# Patient Record
Sex: Female | Born: 1990 | Race: Asian | Hispanic: No | Marital: Single | Smoking: Never smoker
Health system: Southern US, Community
[De-identification: ages and names within clinical notes are randomized; demographics above are authoritative.]

## PROBLEM LIST (undated history)

## (undated) ENCOUNTER — Inpatient Hospital Stay (HOSPITAL_COMMUNITY): Payer: Self-pay

## (undated) DIAGNOSIS — Z789 Other specified health status: Secondary | ICD-10-CM

## (undated) HISTORY — PX: ECTOPIC PREGNANCY SURGERY: SHX613

---

## 2016-09-06 ENCOUNTER — Other Ambulatory Visit (HOSPITAL_COMMUNITY): Payer: Self-pay | Admitting: Nurse Practitioner

## 2016-09-06 DIAGNOSIS — Z369 Encounter for antenatal screening, unspecified: Secondary | ICD-10-CM

## 2016-09-06 LAB — OB RESULTS CONSOLE HIV ANTIBODY (ROUTINE TESTING): HIV: NONREACTIVE

## 2016-09-06 LAB — OB RESULTS CONSOLE GC/CHLAMYDIA
CHLAMYDIA, DNA PROBE: NEGATIVE
GC PROBE AMP, GENITAL: NEGATIVE

## 2016-09-06 LAB — OB RESULTS CONSOLE RUBELLA ANTIBODY, IGM: Rubella: IMMUNE

## 2016-09-06 LAB — OB RESULTS CONSOLE RPR: RPR: NONREACTIVE

## 2016-09-06 LAB — OB RESULTS CONSOLE HEPATITIS B SURFACE ANTIGEN: HEP B S AG: NEGATIVE

## 2016-09-14 ENCOUNTER — Encounter (HOSPITAL_COMMUNITY): Payer: Self-pay | Admitting: *Deleted

## 2016-09-15 ENCOUNTER — Encounter (HOSPITAL_COMMUNITY): Payer: Self-pay

## 2016-09-15 ENCOUNTER — Ambulatory Visit (HOSPITAL_COMMUNITY): Admission: RE | Admit: 2016-09-15 | Payer: Medicaid Other | Source: Ambulatory Visit

## 2016-09-15 ENCOUNTER — Other Ambulatory Visit (HOSPITAL_COMMUNITY): Payer: Self-pay | Admitting: Nurse Practitioner

## 2016-09-15 ENCOUNTER — Ambulatory Visit (HOSPITAL_COMMUNITY)
Admission: RE | Admit: 2016-09-15 | Discharge: 2016-09-15 | Disposition: A | Discharge status: Home / Self Care | Payer: Medicaid Other | Source: Ambulatory Visit | Attending: Nurse Practitioner | Admitting: Nurse Practitioner

## 2016-09-15 VITALS — BP 107/53 | HR 76 | Wt 132.2 lb

## 2016-09-15 DIAGNOSIS — Z369 Encounter for antenatal screening, unspecified: Secondary | ICD-10-CM

## 2016-09-15 DIAGNOSIS — Z3A15 15 weeks gestation of pregnancy: Secondary | ICD-10-CM

## 2016-09-15 DIAGNOSIS — Z36 Encounter for antenatal screening of mother: Secondary | ICD-10-CM | POA: Insufficient documentation

## 2016-09-15 DIAGNOSIS — Z3689 Encounter for other specified antenatal screening: Secondary | ICD-10-CM

## 2016-09-15 DIAGNOSIS — Z3492 Encounter for supervision of normal pregnancy, unspecified, second trimester: Secondary | ICD-10-CM

## 2016-09-15 HISTORY — DX: Other specified health status: Z78.9

## 2016-10-06 ENCOUNTER — Ambulatory Visit (HOSPITAL_COMMUNITY)
Admission: RE | Admit: 2016-10-06 | Discharge: 2016-10-06 | Disposition: A | Discharge status: Home / Self Care | Payer: Medicaid Other | Source: Ambulatory Visit | Attending: Nurse Practitioner | Admitting: Nurse Practitioner

## 2016-10-06 ENCOUNTER — Other Ambulatory Visit (HOSPITAL_COMMUNITY): Payer: Self-pay | Admitting: Maternal and Fetal Medicine

## 2016-10-06 DIAGNOSIS — Z3689 Encounter for other specified antenatal screening: Secondary | ICD-10-CM

## 2016-10-06 DIAGNOSIS — Z362 Encounter for other antenatal screening follow-up: Secondary | ICD-10-CM | POA: Insufficient documentation

## 2016-10-06 DIAGNOSIS — Z3A18 18 weeks gestation of pregnancy: Secondary | ICD-10-CM | POA: Diagnosis not present

## 2016-12-27 NOTE — L&D Delivery Note (Signed)
Delivery Note At 1119 on 02/16/2017, a viable female was delivered via  (Presentation: vertex, LOA  ).  APGAR: 8, 9 Placenta status: spontaneous, intact.  Cord: 3 vessel with the following complications: none.  Anesthesia:  Epidural Episiotomy: None Lacerations: 2nd degree periurethral and perianal Suture Repair: 2.0 vicryl Est. Blood Loss (mL): 200  Mom to postpartum.  Baby to Couplet care / Skin to Skin.  Wendee Beaversavid J Kristi Hyer, DO, PGY-1 02/16/2017, 11:43 AM

## 2017-01-20 ENCOUNTER — Encounter (HOSPITAL_COMMUNITY): Payer: Self-pay | Admitting: *Deleted

## 2017-01-20 ENCOUNTER — Inpatient Hospital Stay (HOSPITAL_COMMUNITY)
Admission: AD | Admit: 2017-01-20 | Discharge: 2017-01-20 | Disposition: A | Discharge status: Home / Self Care | Payer: Medicaid Other | Source: Ambulatory Visit | Attending: Obstetrics and Gynecology | Admitting: Obstetrics and Gynecology

## 2017-01-20 DIAGNOSIS — O26893 Other specified pregnancy related conditions, third trimester: Secondary | ICD-10-CM | POA: Insufficient documentation

## 2017-01-20 DIAGNOSIS — Z3A33 33 weeks gestation of pregnancy: Secondary | ICD-10-CM | POA: Diagnosis present

## 2017-01-20 DIAGNOSIS — O0913 Supervision of pregnancy with history of ectopic or molar pregnancy, third trimester: Secondary | ICD-10-CM | POA: Insufficient documentation

## 2017-01-20 DIAGNOSIS — O4703 False labor before 37 completed weeks of gestation, third trimester: Secondary | ICD-10-CM | POA: Insufficient documentation

## 2017-01-20 DIAGNOSIS — R109 Unspecified abdominal pain: Secondary | ICD-10-CM | POA: Diagnosis present

## 2017-01-20 LAB — URINALYSIS, ROUTINE W REFLEX MICROSCOPIC
Bilirubin Urine: NEGATIVE
Glucose, UA: 50 mg/dL — AB
Hgb urine dipstick: NEGATIVE
Ketones, ur: NEGATIVE mg/dL
Nitrite: NEGATIVE
PH: 6 (ref 5.0–8.0)
Protein, ur: NEGATIVE mg/dL
SPECIFIC GRAVITY, URINE: 1.016 (ref 1.005–1.030)

## 2017-01-20 LAB — WET PREP, GENITAL
CLUE CELLS WET PREP: NONE SEEN
SPERM: NONE SEEN
TRICH WET PREP: NONE SEEN
Yeast Wet Prep HPF POC: NONE SEEN

## 2017-01-20 LAB — FETAL FIBRONECTIN: FETAL FIBRONECTIN: POSITIVE — AB

## 2017-01-20 MED ORDER — BETAMETHASONE SOD PHOS & ACET 6 (3-3) MG/ML IJ SUSP
12.0000 mg | Freq: Once | INTRAMUSCULAR | Status: AC
  Administered 2017-01-20: 12 mg via INTRAMUSCULAR
  Filled 2017-01-20: qty 2

## 2017-01-20 NOTE — Discharge Instructions (Signed)
Preterm Labor and Birth Information °Pregnancy normally lasts 39-41 weeks. Preterm labor is when labor starts early. It starts before you have been pregnant for 37 whole weeks. °What are the risk factors for preterm labor? °Preterm labor is more likely to occur in women who: °· Have an infection while pregnant. °· Have a cervix that is short. °· Have gone into preterm labor before. °· Have had surgery on their cervix. °· Are younger than age 25. °· Are older than age 35. °· Are African American. °· Are pregnant with two or more babies. °· Take street drugs while pregnant. °· Smoke while pregnant. °· Do not gain enough weight while pregnant. °· Got pregnant right after another pregnancy. °What are the symptoms of preterm labor? °Symptoms of preterm labor include: °· Cramps. The cramps may feel like the cramps some women get during their period. The cramps may happen with watery poop (diarrhea). °· Pain in the belly (abdomen). °· Pain in the lower back. °· Regular contractions or tightening. It may feel like your belly is getting tighter. °· Pressure in the lower belly that seems to get stronger. °· More fluid (discharge) leaking from the vagina. The fluid may be watery or bloody. °· Water breaking. °Why is it important to notice signs of preterm labor? °Babies who are born early may not be fully developed. They have a higher chance for: °· Long-term heart problems. °· Long-term lung problems. °· Trouble controlling body systems, like breathing. °· Bleeding in the brain. °· A condition called cerebral palsy. °· Learning difficulties. °· Death. °These risks are highest for babies who are born before 34 weeks of pregnancy. °How is preterm labor treated? °Treatment depends on: °· How long you were pregnant. °· Your condition. °· The health of your baby. °Treatment may involve: °· Having a stitch (suture) placed in your cervix. When you give birth, your cervix opens so the baby can come out. The stitch keeps the cervix  from opening too soon. °· Staying at the hospital. °· Taking or getting medicines, such as: °¨ Hormone medicines. °¨ Medicines to stop contractions. °¨ Medicines to help the baby’s lungs develop. °¨ Medicines to prevent your baby from having cerebral palsy. °What should I do if I am in preterm labor? °If you think you are going into labor too soon, call your doctor right away. °How can I prevent preterm labor? °· Do not use any tobacco products. °¨ Examples of these are cigarettes, chewing tobacco, and e-cigarettes. °¨ If you need help quitting, ask your doctor. °· Do not use street drugs. °· Do not use any medicines unless you ask your doctor if they are safe for you. °· Talk with your doctor before taking any herbal supplements. °· Make sure you gain enough weight. °· Watch for infection. If you think you might have an infection, get it checked right away. °· If you have gone into preterm labor before, tell your doctor. °This information is not intended to replace advice given to you by your health care provider. Make sure you discuss any questions you have with your health care provider. °Document Released: 03/11/2009 Document Revised: 05/25/2016 Document Reviewed: 05/05/2016 °Elsevier Interactive Patient Education © 2017 Elsevier Inc. ° °

## 2017-01-20 NOTE — MAU Note (Signed)
Pt reports having cramping q 30 min since 6am. Aslo c/o a thick yellow vag discharge. Good fetal movement reported .

## 2017-01-20 NOTE — MAU Provider Note (Signed)
History     CSN: [id number]  Arrival date and time: 01/20/17 1230   First Provider Initiated Contact with Patient 01/20/17 1322      Chief Complaint  Patient presents with  . Abdominal Cramping   HPI [name] is a 26 y.o. G2P0010 at 2545w6d who presents to MAU today with complaint of contractions. The patient states contractions q 1-5 minutes since earlier today. She denies vaginal bleeding or LOF, but endorses a thick, yellow discharge today. She reports good fetal movement. She denies complications with the pregnancy. She has been receiving prenatal care with [facility]. She denies intercourse x 1 month.   OB History    Gravida Para Term Preterm AB Living   2       1 0   SAB TAB Ectopic Multiple Live Births       1          Past Medical History:  Diagnosis Date  . Medical history non-contributory     Past Surgical History:  Procedure Laterality Date  . ECTOPIC PREGNANCY SURGERY      History reviewed. No pertinent family history.  Social History  Substance Use Topics  . Smoking status: Never Smoker  . Smokeless tobacco: Never Used  . Alcohol use No    Allergies: No Known Allergies  Prescriptions Prior to Admission  Medication Sig Dispense Refill Last Dose  . Prenatal Vit-Fe Fumarate-FA (PRENATAL VITAMIN PO) Take 1 tablet by mouth daily.    01/19/2017 at Unknown time    Review of Systems  Constitutional: Negative for fever.  Gastrointestinal: Positive for abdominal pain.  Genitourinary: Positive for vaginal discharge. Negative for vaginal bleeding.   Physical Exam   Blood pressure 119/78, pulse 95, temperature 98.1 F (36.7 C), resp. rate 18, weight 158 lb 12.8 oz (72 kg), last menstrual period 06/10/2016.  Physical Exam  Nursing note and vitals reviewed. Constitutional: She is oriented to person, place, and time. She appears well-developed and well-nourished. No distress.  HENT:  Head: Normocephalic and atraumatic.  Cardiovascular: Normal rate.    Respiratory: Effort normal.  GI: Soft. She exhibits no distension and no mass. There is no tenderness. There is no rebound and no guarding.  Genitourinary: Uterus is enlarged. Uterus is not tender. Cervix exhibits friability. Cervix exhibits no motion tenderness and no discharge. No bleeding in the vagina. Vaginal discharge (small amount of mucus discharge noted) found.  Neurological: She is alert and oriented to person, place, and time.  Skin: Skin is warm and dry. No erythema.  Psychiatric: She has a normal mood and affect.  Dilation: 1 Effacement (%): Thick Cervical Position: Posterior Presentation: Vertex Exam by:: J.Wenzle, PA   Results for orders placed or performed during the hospital encounter of 01/20/17 (from the past 24 hour(s))  Urinalysis, Routine w reflex microscopic     Status: Abnormal   Collection Time: 01/20/17 12:54 PM  Result Value Ref Range   Color, Urine YELLOW YELLOW   APPearance HAZY (A) CLEAR   Specific Gravity, Urine 1.016 1.005 - 1.030   pH 6.0 5.0 - 8.0   Glucose, UA 50 (A) NEGATIVE mg/dL   Hgb urine dipstick NEGATIVE NEGATIVE   Bilirubin Urine NEGATIVE NEGATIVE   Ketones, ur NEGATIVE NEGATIVE mg/dL   Protein, ur NEGATIVE NEGATIVE mg/dL   Nitrite NEGATIVE NEGATIVE   Leukocytes, UA SMALL (A) NEGATIVE   RBC / HPF 0-5 0 - 5 RBC/hpf   WBC, UA 0-5 0 - 5 WBC/hpf   Bacteria, UA RARE (  A) NONE SEEN   Squamous Epithelial / LPF 6-30 (A) NONE SEEN   Mucous PRESENT   Wet prep, genital     Status: Abnormal   Collection Time: 01/20/17  1:35 PM  Result Value Ref Range   Yeast Wet Prep HPF POC NONE SEEN NONE SEEN   Trich, Wet Prep NONE SEEN NONE SEEN   Clue Cells Wet Prep HPF POC NONE SEEN NONE SEEN   WBC, Wet Prep HPF POC MODERATE (A) NONE SEEN   Sperm NONE SEEN   Fetal fibronectin     Status: Abnormal   Collection Time: 01/20/17  1:35 PM  Result Value Ref Range   Fetal Fibronectin POSITIVE (A) NEGATIVE   Fetal Monitoring: Baseline: 130 bpm Variability:  moderate Accelerations: 15 x 15 Decelerations: none Contractions: q 8-20 minutes  MAU Course  Procedures None  MDM UA, wet prep, GC/Chlamydia and FFN today  +FFN BMZ given today Cervix unchanged after > 1 hour.  Discussed patient with Dr. Alysia Penna, he agrees that tocolysis is not necessary at this time since the patient does not appear uncomfortable and contractions are so infrequent. Return in 24 hours for second dose of BMZ.   Assessment and Plan  A: SIUP at [redacted]w[redacted]d Preterm labor   P: Discharge home Preterm labor precautions discussed Patient advised to follow-up in MAU for second dose of BMZ Patient may return to MAU as needed or if her condition were to change or worsen  [name], PA-C  01/20/2017, 2:23 PM

## 2017-01-21 ENCOUNTER — Inpatient Hospital Stay (HOSPITAL_COMMUNITY)
Admission: AD | Admit: 2017-01-21 | Discharge: 2017-01-21 | Disposition: A | Discharge status: Home / Self Care | Payer: Medicaid Other | Source: Ambulatory Visit | Attending: Obstetrics & Gynecology | Admitting: Obstetrics & Gynecology

## 2017-01-21 DIAGNOSIS — Z3A00 Weeks of gestation of pregnancy not specified: Secondary | ICD-10-CM | POA: Insufficient documentation

## 2017-01-21 LAB — GC/CHLAMYDIA PROBE AMP (~~LOC~~) NOT AT ARMC
CHLAMYDIA, DNA PROBE: NEGATIVE
NEISSERIA GONORRHEA: NEGATIVE

## 2017-01-21 MED ORDER — BETAMETHASONE SOD PHOS & ACET 6 (3-3) MG/ML IJ SUSP
12.0000 mg | Freq: Once | INTRAMUSCULAR | Status: AC
  Administered 2017-01-21: 12 mg via INTRAMUSCULAR
  Filled 2017-01-21: qty 2

## 2017-01-23 ENCOUNTER — Inpatient Hospital Stay (HOSPITAL_COMMUNITY)
Admission: AD | Admit: 2017-01-23 | Discharge: 2017-01-23 | Disposition: A | Discharge status: Home / Self Care | Payer: Medicaid Other | Source: Ambulatory Visit | Attending: Obstetrics and Gynecology | Admitting: Obstetrics and Gynecology

## 2017-01-23 ENCOUNTER — Encounter (HOSPITAL_COMMUNITY): Payer: Self-pay | Admitting: *Deleted

## 2017-01-23 ENCOUNTER — Other Ambulatory Visit: Payer: Self-pay | Admitting: Family

## 2017-01-23 DIAGNOSIS — Z3A34 34 weeks gestation of pregnancy: Secondary | ICD-10-CM | POA: Diagnosis present

## 2017-01-23 DIAGNOSIS — O9989 Other specified diseases and conditions complicating pregnancy, childbirth and the puerperium: Secondary | ICD-10-CM | POA: Diagnosis not present

## 2017-01-23 DIAGNOSIS — K219 Gastro-esophageal reflux disease without esophagitis: Secondary | ICD-10-CM | POA: Insufficient documentation

## 2017-01-23 DIAGNOSIS — O99613 Diseases of the digestive system complicating pregnancy, third trimester: Secondary | ICD-10-CM | POA: Insufficient documentation

## 2017-01-23 MED ORDER — PANTOPRAZOLE SODIUM 20 MG PO TBEC: 20.0000 mg | DELAYED_RELEASE_TABLET | Freq: Every day | ORAL | 0 refills | Status: DC

## 2017-01-23 NOTE — MAU Note (Signed)
Since receiving second BMZ shot yesterday alittle hard to breathe. Worse after eating. Does have a dry cough. No pain. No vag bleeding or d/c or LOF.

## 2017-01-23 NOTE — Discharge Instructions (Signed)
B?nh trào ng??c d? dày th?c qu?n, Ng??i l?n  (Gastroesophageal Reflux Disease, Adult)  Thông th??ng, th?c ?n di chuy?n xu?ng th?c qu?n và ? l?i d? dày ?? tiêu hóa. Tuy nhiên, khi m?t ng??i b? b?nh trào ng??c d? dày th?c qu?n (GERD), th?c ?n và a xít trong d? dày trào ng??c tr? l?i th?c qu?n. Khi tình tr?ng này x?y ra, th?c qu?n b? loét và viêm. D?n d?n, GERD có th? t?o ra nh?ng l? nh? (v?t loét) trên l?p niêm m?c th?c qu?n.  NGUYÊN NHÂN  Tình tr?ng này gây ra b?i m?t v?n ?? c?a ph?n c? gi?a th?c qu?n và d? dày (c? th?t th?c qu?n d??i, hay LES). Thông th??ng c? LES ?óng l?i sau khi th?c ?n ?i qua th?c qu?n vào d? dày. Khi LES b? y?u ho?c b?t th??ng, c? không ?óng theo ?úng cách và ?i?u ?ó cho phép th?c ?n và a xít d? dày trào ng??c tr? l?i th?c qu?n. LES có th? b? y?u do m?t s? ch?t ?n kiêng nh?t ??nh, thu?c và các tình tr?ng b?nh lý, bao g?m:  · S? d?ng thu?c lá.  · Mang thai.  · Thoát v? hoành.  · S? d?ng nhi?u r??u.  · M?t s? lo?i th?c ?n và ?? u?ng nh?t ??nh, nh? cà phê, sô cô la, hành và b?c hà.  CÁC Y?U T? NGUY C?  Tình tr?ng này hay x?y ra h?n ?:  · Nh?ng ng??i t?ng cân.  · Nh?ng ng??i có các b?nh ? mô liên k?t.  · Nh?ng ng??i s? d?ng thu?c NSAID.  TRI?U CH?NG  Nh?ng tri?u ch?ng c?a tình tr?ng này bao g?m:  · ? nóng.  · Khó nu?t ho?c ?au khi nu?t.  · C?m th?y nh? có m?t kh?i c?c trong c? h?ng.  · C?m giác ??ng trong mi?ng.  · H?i th? hôi.  · Có nhi?u n??c b?t.  · C?m giác khó ch?u trong b?ng ho?c ch??ng b?ng.  · ? h?i.  · ?au ng?c.  · Khó th? ho?c th? khò khè.  · Ho liên t?c (m?n tính) ho?c ho vào ban ?êm.  · B? h?ng l?p men r?ng.  · S?t cân.  Nh?ng tình tr?ng khác nhau có th? gây ?au ng?c. B?o ??m ph?i ??n khám chuyên gia ch?m sóc s?c kh?e n?u quý v? b? ?au ng?c.  CH?N ?OÁN  Chuyên gia ch?m sóc s?c kh?e c?a quý v? s? h?i v? b?nh s? và khám th?c th? cho quý v?. ?? xác ??nh quý v? b? GERD nh? hay n?ng, chuyên gia ch?m sóc s?c kh?e c?ng có th? theo dõi quý v? ?áp ?ng v?i vi?c ?i?u tr? nh? th? nào. Quý v? c?ng có  th? ph?i làm các ki?m tra khác, bao g?m:  · N?i soi ?? ki?m tra d? dày và th?c qu?n b?ng m?t camera nh?.  · Ki?m tra ?o n?ng ?? a xít trong th?c qu?n c?a quý v?.  · Ki?m tra ?o m?c áp l?c lên th?c qu?n c?a quý v?.  · Nu?t bari ho?c nu?t bari ?i?u ch?nh ?? hi?n th? hình dáng, kích th??c và ch?c n?ng c?a th?c qu?n c?a quý v?.  ?I?U TR?  M?c tiêu c?a ?i?u tr? là giúp gi?m các tri?u ch?ng và tránh bi?n ch?ng. Vi?c ?i?u tr? b?nh này có th? khác nhau tùy thu?c m?c ?? n?ng c?a tri?u ch?ng. Chuyên gia ch?m sóc s?c kh?e c?a quý v? có th? khuy?n ngh?:  · Thay ??i ch? ?? ?  n.  · Thu?c.  · Ph?u thu?t.  H??NG D?N CH?M SÓC T?I NHÀ  Ch? ?? ?n  · Tuân th? m?t ch? ?? ?n theo khuy?n ngh? c?a chuyên gia ch?m sóc s?c kh?e. Vi?c này có th? là tránh các th?c ?n và ?? u?ng nh?:  ? Cà phê và trà (có ho?c không có caffeine).  ? ?? u?ng có ch?a r??u.  ? ?? u?ng t?ng l?c và ?? u?ng dùng trong th? thao.  ? ?? u?ng có ga ho?c soda.  ? Sô cô la và cô ca.  ? B?c hà và h??ng v? b?c hà.  ? T?i và hành.  ? C?i ng?a (Horseradish).  ? Các th?c ?n nhi?u gia v? và a xít, bao g?m h?t tiêu, b?t ?t, b?t ca ri, gi?m, n??c s?t cay, và n??c s?t barbecue.  ? N??c qu? ho?c qu? h? cam quýt, ch?ng h?n nh? cam, chanh và chanh lá cam.  ? Các th?c ?n có cà chua, nh? n??c x?t ??, ?t, n??c x?t salsa, và pizza kèm x?t ??  ? Th?c ?n chiên và nhi?u ch?t béo, ch?ng h?n nh? bánh rán, khoai tây chiên, khoai tây rán và n??c x?t nhi?u ch?t béo.  ? Th?t nhi?u ch?t béo, ch?ng h?n nh? hot dog (bánh mì k?p xúc xích) và các lo?i th?t ?? và tr?ng nhi?u m?, ch?ng h?n nh? th?t n?c l?ng, xúc xích, gi?m bông và th?t l?n xông khói.  ? Nh?ng s?n ph?m b? s?a giàu ch?t béo, nh? s?a nguyên kem, b? và pho mát kem.  · ?n các b?a nh?, th??ng xuyên thay vì các b?a no.  · Tránh u?ng nhi?u n??c khi quý v? ?n.  · Tránh ?n trong kho?ng 2-3 gi? tr??c khi ?i ng?.  · Tránh n?m xu?ng ngay sau khi ?n.  · Khôngt?p th? d?c ngay sau khi ?n.  H??ng d?n chung  · Chú ý ??n nh?ng thay ??i v? tri?u ch?ng c?a quý  v?.  · Ch? s? d?ng thu?c không c?n kê ??n và thu?c c?n kê ??n theo ch? d?n c?a chuyên gia ch?m sóc s?c kh?e. Không dùng aspirin, ibuprofen, ho?c các thu?c NSAID khác tr? khi chuyên gia ch?m sóc s?c kh?e c?a quý v? cho phép.  · Không s? d?ng b?t k? s?n ph?m thu?c lá nào, bao g?m thu?c lá d?ng hút, thu?c lá d?ng nhai và thu?c lá ?i?n t?. N?u quý v? c?n giúp ?? ?? cai thu?c, hãy h?i chuyên gia ch?m sóc s?c kh?e.  · M?c qu?n áo r?ng. Không m?c cái gì ch?t quanh eo mà có th? t?o áp l?c lên b?ng.  · Nâng (nâng cao) ??u gi??ng c?a quý v? thêm 6 inch (15 cm).  · C? g?ng gi?m c?ng th?ng, ch?ng h?n nh? t?p yoga ho?c thi?n. N?u quý v? c?n giúp ?? ?? gi?m c?ng th?ng, hãy h?i chuyên gia ch?m sóc s?c kh?e.  · N?u quý v? th?a cân, hãy gi?m cân n?ng v? m?c có l?i cho s?c kh?e c?a quý v?. Hãy h?i chuyên gia ch?m sóc s?c kh?e ?? ???c h??ng d?n v? m?c tiêu gi?m cân an toàn.  · Tuân th? t?t c? các cu?c h?n khám l?i theo ch? d?n c?a chuyên gia ch?m sóc s?c kh?e. ?i?u này có vai trò quan tr?ng.  ?I KHÁM N?U:  · Quý v? có các tri?u ch?ng m?i.  · Quý v? b? s?t cân không rõ nguyên nhân.  · Quý v? b? khó nu?t ho?c b? ?au   khi nu?t.  · Quý v? th? khò khè ho?c ho dai d?ng.  · Các tri?u ch?ng c?a quý v? không c?i thi?n sau khi ???c ?i?u tr?.  · Quý v? b? kh?n gi?ng.    NGAY L?P T?C ?I KHÁM N?U:  · Quý v? b? ?au ? cánh tay, c?, hàm, r?ng ho?c l?ng.  · Quý v? th?y ?? m? hôi, chóng m?t ho?c choáng váng.  · Quý v? b? ?au ng?c ho?c khó th?.  · Quý v? nôn và ch?t nôn ra gi?ng nh? máu ho?c bã cà phê.  · Quý v? b? ng?t.  · Phân c?a quý v? có máu ho?c màu ?en.  · Quý v? không th? nu?t, u?ng hay ?n.    Thông tin này không nh?m m?c ?ích thay th? cho l?i khuyên mà chuyên gia ch?m sóc s?c kh?e nói v?i quý v?. Hãy b?o ??m quý v? ph?i th?o lu?n b?t k? v?n ?? gì mà quý v? có v?i chuyên gia ch?m sóc s?c kh?e c?a quý v?.  Document Released: 09/22/2005 Document Revised: 09/03/2015 Document Reviewed: 04/09/2015  Elsevier Interactive Patient Education © 2017  Elsevier Inc.

## 2017-01-23 NOTE — MAU Provider Note (Signed)
History   [id number]   Chief Complaint  Patient presents with  . hard to breathe    HPI Adrienne Morris is a 26 y.o. female  G2P0010 at [redacted]w[redacted]d IUP here with report mid-epigastric burning associated with eating.  When burning occurs patient feels short of breath.  Also reports increased belching and coughing after eating.  Denies palpitation, diaphoresis, or radiating pain.  No report of contractions, leaking of fluid, or vaginal bleeding.  +fetal movement.    Patient's last menstrual period was 06/10/2016.  OB History  Gravida Para Term Preterm AB Living  2       1 0  SAB TAB Ectopic Multiple Live Births      1        # Outcome Date GA Lbr Len/2nd Weight Sex Delivery Anes PTL Lv  2 Current           1 Ectopic               Past Medical History:  Diagnosis Date  . Medical history non-contributory     History reviewed. No pertinent family history.  Social History   Social History  . Marital status: Legally Separated    Spouse name: N/A  . Number of children: N/A  . Years of education: N/A   Social History Main Topics  . Smoking status: Never Smoker  . Smokeless tobacco: Never Used  . Alcohol use No  . Drug use: No  . Sexual activity: Yes    Birth control/ protection: None   Other Topics Concern  . None   Social History Narrative  . None    No Known Allergies  No current facility-administered medications on file prior to encounter.    Current Outpatient Prescriptions on File Prior to Encounter  Medication Sig Dispense Refill  . Prenatal Vit-Fe Fumarate-FA (PRENATAL VITAMIN PO) Take 1 tablet by mouth daily.        Review of Systems  Constitutional: Negative for chills and fever.  Respiratory: Positive for shortness of breath. Negative for choking, chest tightness and wheezing.   Cardiovascular: Negative for chest pain.  Gastrointestinal: Positive for abdominal pain. Negative for diarrhea, nausea and vomiting.  Genitourinary: Negative for pelvic pain, vaginal  bleeding and vaginal discharge.  All other systems reviewed and are negative.    Physical Exam   Vitals:   01/23/17 0025  BP: 123/72  Pulse: 84  Resp: 18  Temp: 97.8 F (36.6 C)  SpO2: 99%  Weight: 72.5 kg (159 lb 12.8 oz)  Height: 5\' 6"  (1.676 m)    Physical Exam  Constitutional: She is oriented to person, place, and time. She appears well-developed and well-nourished.  HENT:  Head: Normocephalic.  Neck: Normal range of motion. Neck supple.  Cardiovascular: Normal rate, regular rhythm and normal heart sounds.  Exam reveals no gallop and no friction rub.   No murmur heard. Respiratory: Effort normal and breath sounds normal. No respiratory distress. She has no wheezes. She has no rales. She exhibits no tenderness.  GI: Soft. There is no tenderness.  Genitourinary: No bleeding in the vagina.  Musculoskeletal: Normal range of motion. She exhibits no edema.  Neurological: She is alert and oriented to person, place, and time.  Skin: Skin is warm and dry.   FHR 122, +accels, no decels Toco - none  MAU Course  Procedures  MDM No procedures needed; likely GERD  Assessment and Plan  25 y.o. G2P0010 at [redacted]w[redacted]d IUP  GERD Reactive NST  Plan: Discharge  home RX Protonix 20 mg PO q day [facility]  Marlis EdelsonWalidah N Karim, CNM 01/23/2017 3:05 AM

## 2017-02-07 LAB — OB RESULTS CONSOLE GBS: STREP GROUP B AG: NEGATIVE

## 2017-02-15 ENCOUNTER — Other Ambulatory Visit: Payer: Self-pay | Admitting: Family

## 2017-02-15 ENCOUNTER — Encounter (HOSPITAL_COMMUNITY): Payer: Self-pay

## 2017-02-15 ENCOUNTER — Inpatient Hospital Stay (HOSPITAL_COMMUNITY)
Admission: AD | Admit: 2017-02-15 | Discharge: 2017-02-18 | DRG: 775 | Disposition: A | Discharge status: Home / Self Care | Payer: Medicaid Other | Source: Ambulatory Visit | Attending: Family Medicine | Admitting: Family Medicine

## 2017-02-15 DIAGNOSIS — K219 Gastro-esophageal reflux disease without esophagitis: Secondary | ICD-10-CM | POA: Diagnosis present

## 2017-02-15 DIAGNOSIS — Z3A37 37 weeks gestation of pregnancy: Secondary | ICD-10-CM | POA: Diagnosis not present

## 2017-02-15 DIAGNOSIS — O9962 Diseases of the digestive system complicating childbirth: Secondary | ICD-10-CM | POA: Diagnosis present

## 2017-02-15 DIAGNOSIS — IMO0002 Reserved for concepts with insufficient information to code with codable children: Secondary | ICD-10-CM | POA: Diagnosis present

## 2017-02-15 DIAGNOSIS — O4202 Full-term premature rupture of membranes, onset of labor within 24 hours of rupture: Secondary | ICD-10-CM | POA: Diagnosis present

## 2017-02-15 LAB — CBC
HEMATOCRIT: 32.9 % — AB (ref 36.0–46.0)
HEMOGLOBIN: 11.3 g/dL — AB (ref 12.0–15.0)
MCH: 28.8 pg (ref 26.0–34.0)
MCHC: 34.3 g/dL (ref 30.0–36.0)
MCV: 83.7 fL (ref 78.0–100.0)
Platelets: 199 10*3/uL (ref 150–400)
RBC: 3.93 MIL/uL (ref 3.87–5.11)
RDW: 13.5 % (ref 11.5–15.5)
WBC: 7.9 10*3/uL (ref 4.0–10.5)

## 2017-02-15 LAB — POCT FERN TEST: POCT FERN TEST: POSITIVE

## 2017-02-15 MED ORDER — LIDOCAINE HCL (PF) 1 % IJ SOLN
30.0000 mL | INTRAMUSCULAR | Status: AC | PRN
  Administered 2017-02-16: 30 mL via SUBCUTANEOUS
  Filled 2017-02-15: qty 30

## 2017-02-15 MED ORDER — LACTATED RINGERS IV SOLN
INTRAVENOUS | Status: DC
  Administered 2017-02-15: 1000 mL via INTRAVENOUS
  Administered 2017-02-16 (×2): via INTRAVENOUS

## 2017-02-15 MED ORDER — OXYTOCIN 40 UNITS IN LACTATED RINGERS INFUSION - SIMPLE MED
2.5000 [IU]/h | INTRAVENOUS | Status: DC
  Filled 2017-02-15: qty 1000

## 2017-02-15 MED ORDER — OXYCODONE-ACETAMINOPHEN 5-325 MG PO TABS
2.0000 | ORAL_TABLET | ORAL | Status: DC | PRN
Start: 2017-02-15 — End: 2017-02-16

## 2017-02-15 MED ORDER — ACETAMINOPHEN 325 MG PO TABS: 650.0000 mg | ORAL_TABLET | ORAL | Status: DC | PRN

## 2017-02-15 MED ORDER — OXYTOCIN BOLUS FROM INFUSION
500.0000 mL | Freq: Once | INTRAVENOUS | Status: AC
  Administered 2017-02-16: 500 mL via INTRAVENOUS

## 2017-02-15 MED ORDER — SOD CITRATE-CITRIC ACID 500-334 MG/5ML PO SOLN: 30.0000 mL | ORAL | Status: DC | PRN

## 2017-02-15 MED ORDER — ONDANSETRON HCL 4 MG/2ML IJ SOLN
4.0000 mg | Freq: Four times a day (QID) | INTRAMUSCULAR | Status: DC | PRN
  Administered 2017-02-16: 4 mg via INTRAVENOUS
  Filled 2017-02-15: qty 2

## 2017-02-15 MED ORDER — LACTATED RINGERS IV SOLN: 500.0000 mL | INTRAVENOUS | Status: DC | PRN

## 2017-02-15 MED ORDER — FENTANYL CITRATE (PF) 100 MCG/2ML IJ SOLN
100.0000 ug | INTRAMUSCULAR | Status: DC | PRN
  Administered 2017-02-16 (×2): 100 ug via INTRAVENOUS
  Filled 2017-02-15 (×2): qty 2

## 2017-02-15 MED ORDER — OXYCODONE-ACETAMINOPHEN 5-325 MG PO TABS
1.0000 | ORAL_TABLET | ORAL | Status: DC | PRN
Start: 2017-02-15 — End: 2017-02-16

## 2017-02-15 NOTE — MAU Note (Signed)
Pt reports water broke about 2015, having no stomach pains. Denies any problems with the pregnancy. Denies any bleeding today. Reports positive fetal movement.

## 2017-02-15 NOTE — H&P (Signed)
LABOR AND DELIVERY ADMISSION HISTORY AND PHYSICAL NOTE  Adrienne Morris is a 26 y.o. female G2P0010 with IUP at 1834w4d by 15 wk US presenting for PROM at 815 PM tonight. She is reporting some occasional contractions but she overall comfortable. She has not other concerns . Sister is at bedside and is interpreting for the patient. Nursing verified with the patient that she wanted her sister [facility].   She reports positive fetal movement. She denies leakage of fluid or vaginal bleeding.  Prenatal History/Complications:  Past Medical History: Past Medical History:  Diagnosis Date  . Medical history non-contributory     Past Surgical History: Past Surgical History:  Procedure Laterality Date  . ECTOPIC PREGNANCY SURGERY      Obstetrical History: OB History    Gravida Para Term Preterm AB Living   2       1 0   SAB TAB Ectopic Multiple Live Births       1          Social History: Social History   Social History  . Marital status: Legally Separated    Spouse name: N/A  . Number of children: N/A  . Years of education: N/A   Social History Main Topics  . Smoking status: Never Smoker  . Smokeless tobacco: Never Used  . Alcohol use No  . Drug use: No  . Sexual activity: Yes    Birth control/ protection: None   Other Topics Concern  . None   Social History Narrative  . None    Family History: No family history on file.  Allergies: No Known Allergies  Prescriptions Prior to Admission  Medication Sig Dispense Refill Last Dose  . docusate sodium (COLACE) 100 MG capsule Take 100 mg by mouth daily as needed for mild constipation.   02/15/2017 at Unknown time  . Prenatal Vit-Fe Fumarate-FA (PRENATAL VITAMIN PO) Take 1 tablet by mouth daily.    02/15/2017 at Unknown time  . pantoprazole (PROTONIX) 20 MG tablet TAKE 1 TABLET(20 MG) BY MOUTH DAILY (Patient not taking: Reported on 02/15/2017) 90 tablet 0 Not Taking at Unknown time     Review of  Systems   All systems reviewed and negative except as stated in HPI  Blood pressure 129/87, pulse 88, temperature 98.7 F (37.1 C), temperature source Oral, resp. rate 18, height 5\' 6"  (1.676 m), weight 159 lb (72.1 kg), last menstrual period 06/10/2016, SpO2 100 %. General appearance: alert, cooperative and appears stated age Lungs: No respirtatory distress, no audible wheezing  Heart: regular rate and rhythm Abdomen: soft, non-tender; bowel sounds normal Extremities: No calf swelling or tenderness Presentation: cephalic by nursing exam Fetal monitoring: category 1 Uterine activity: contractions occasional Dilation: 2 Effacement (%): 90 Station: -1 Exam by:: Dr. Genevie AnnSchenk   Prenatal labs: ABO, Rh:   A+ Antibody:   neg Rubella: immune RPR:   neg HBsAg:   neg HIV:   neg GBS:   neg 1 hr Glucola: 90 Genetic screening:  Quad negative Anatomy US: normal  Prenatal Transfer Tool  Maternal Diabetes: No Genetic Screening: Normal Maternal Ultrasounds/Referrals: Normal Fetal Ultrasounds or other Referrals:  None Maternal Substance Abuse:  No Significant Maternal Medications:  None Significant Maternal Lab Results: Lab values include: Group B Strep negative  Results for orders placed or performed during the hospital encounter of 02/15/17 (from the past 24 hour(s))  POCT fern test   Collection Time: 02/15/17  9:08 PM  Result Value Ref Range  POCT Fern Test Positive = ruptured amniotic membanes   CBC   Collection Time: 02/15/17 10:07 PM  Result Value Ref Range   WBC 7.9 4.0 - 10.5 K/uL   RBC 3.93 3.87 - 5.11 MIL/uL   Hemoglobin 11.3 (L) 12.0 - 15.0 g/dL   HCT 16.1 (L) 09.6 - 04.5 %   MCV 83.7 78.0 - 100.0 fL   MCH 28.8 26.0 - 34.0 pg   MCHC 34.3 30.0 - 36.0 g/dL   RDW 40.9 81.1 - 91.4 %   Platelets 199 150 - 400 K/uL    There are no active problems to display for this patient.   Assessment: Adrienne Morris is a 26 y.o. G2P0010 at [redacted]w[redacted]d here for PROM with occasional  contractions  #Labor:expectant management. If no significant contraction pattern in 8 hours will start pitocin #Pain: epidural #FWB: Category 1 #ID:  GBS negative #MOF: breast #Circ:  n/a  Adrienne Morris 02/15/2017, 10:46 PM

## 2017-02-16 ENCOUNTER — Encounter (HOSPITAL_COMMUNITY): Payer: Self-pay | Admitting: Anesthesiology

## 2017-02-16 ENCOUNTER — Inpatient Hospital Stay (HOSPITAL_COMMUNITY): Payer: Medicaid Other | Admitting: Anesthesiology

## 2017-02-16 DIAGNOSIS — Z3A37 37 weeks gestation of pregnancy: Secondary | ICD-10-CM

## 2017-02-16 MED ORDER — EPHEDRINE 5 MG/ML INJ
10.0000 mg | INTRAVENOUS | Status: DC | PRN
  Filled 2017-02-16: qty 4

## 2017-02-16 MED ORDER — FENTANYL 2.5 MCG/ML BUPIVACAINE 1/10 % EPIDURAL INFUSION (WH - ANES)
14.0000 mL/h | INTRAMUSCULAR | Status: DC | PRN
  Administered 2017-02-16 (×2): 14 mL/h via EPIDURAL

## 2017-02-16 MED ORDER — OXYTOCIN 40 UNITS IN LACTATED RINGERS INFUSION - SIMPLE MED
1.0000 m[IU]/min | INTRAVENOUS | Status: DC
  Administered 2017-02-16: 2 m[IU]/min via INTRAVENOUS

## 2017-02-16 MED ORDER — FENTANYL 2.5 MCG/ML BUPIVACAINE 1/10 % EPIDURAL INFUSION (WH - ANES)
INTRAMUSCULAR | Status: AC
  Filled 2017-02-16: qty 100

## 2017-02-16 MED ORDER — TERBUTALINE SULFATE 1 MG/ML IJ SOLN
0.2500 mg | Freq: Once | INTRAMUSCULAR | Status: DC | PRN
  Filled 2017-02-16: qty 1

## 2017-02-16 MED ORDER — ONDANSETRON HCL 4 MG/2ML IJ SOLN: 4.0000 mg | INTRAMUSCULAR | Status: DC | PRN

## 2017-02-16 MED ORDER — DIPHENHYDRAMINE HCL 25 MG PO CAPS: 25.0000 mg | ORAL_CAPSULE | Freq: Four times a day (QID) | ORAL | Status: DC | PRN

## 2017-02-16 MED ORDER — ONDANSETRON HCL 4 MG PO TABS: 4.0000 mg | ORAL_TABLET | ORAL | Status: DC | PRN

## 2017-02-16 MED ORDER — LACTATED RINGERS IV SOLN
500.0000 mL | Freq: Once | INTRAVENOUS | Status: AC
  Administered 2017-02-16: 500 mL via INTRAVENOUS

## 2017-02-16 MED ORDER — COCONUT OIL OIL: 1.0000 "application " | TOPICAL_OIL | Status: DC | PRN

## 2017-02-16 MED ORDER — SENNOSIDES-DOCUSATE SODIUM 8.6-50 MG PO TABS
2.0000 | ORAL_TABLET | ORAL | Status: DC
  Administered 2017-02-16 – 2017-02-17 (×2): 2 via ORAL
  Filled 2017-02-16 (×2): qty 2

## 2017-02-16 MED ORDER — PHENYLEPHRINE 40 MCG/ML (10ML) SYRINGE FOR IV PUSH (FOR BLOOD PRESSURE SUPPORT)
80.0000 ug | PREFILLED_SYRINGE | INTRAVENOUS | Status: DC | PRN
  Filled 2017-02-16: qty 5

## 2017-02-16 MED ORDER — LIDOCAINE HCL (PF) 1 % IJ SOLN
INTRAMUSCULAR | Status: DC | PRN
  Administered 2017-02-16 (×2): 5 mL via EPIDURAL

## 2017-02-16 MED ORDER — SIMETHICONE 80 MG PO CHEW
80.0000 mg | CHEWABLE_TABLET | ORAL | Status: DC | PRN
Start: 2017-02-16 — End: 2017-02-18

## 2017-02-16 MED ORDER — WITCH HAZEL-GLYCERIN EX PADS
1.0000 | MEDICATED_PAD | CUTANEOUS | Status: DC | PRN
Start: 2017-02-16 — End: 2017-02-18

## 2017-02-16 MED ORDER — BENZOCAINE-MENTHOL 20-0.5 % EX AERO
1.0000 "application " | INHALATION_SPRAY | CUTANEOUS | Status: DC | PRN
  Filled 2017-02-16: qty 56

## 2017-02-16 MED ORDER — IBUPROFEN 600 MG PO TABS
600.0000 mg | ORAL_TABLET | Freq: Four times a day (QID) | ORAL | Status: DC
  Administered 2017-02-16 – 2017-02-18 (×7): 600 mg via ORAL
  Filled 2017-02-16 (×8): qty 1

## 2017-02-16 MED ORDER — DIPHENHYDRAMINE HCL 50 MG/ML IJ SOLN: 12.5000 mg | INTRAMUSCULAR | Status: DC | PRN

## 2017-02-16 MED ORDER — DIBUCAINE 1 % RE OINT: 1.0000 "application " | TOPICAL_OINTMENT | RECTAL | Status: DC | PRN

## 2017-02-16 MED ORDER — PRENATAL MULTIVITAMIN CH
1.0000 | ORAL_TABLET | Freq: Every day | ORAL | Status: DC
  Administered 2017-02-17 – 2017-02-18 (×2): 1 via ORAL
  Filled 2017-02-16 (×2): qty 1

## 2017-02-16 MED ORDER — ACETAMINOPHEN 325 MG PO TABS
650.0000 mg | ORAL_TABLET | ORAL | Status: DC | PRN
  Administered 2017-02-16 (×2): 650 mg via ORAL
  Filled 2017-02-16 (×2): qty 2

## 2017-02-16 MED ORDER — PHENYLEPHRINE 40 MCG/ML (10ML) SYRINGE FOR IV PUSH (FOR BLOOD PRESSURE SUPPORT)
PREFILLED_SYRINGE | INTRAVENOUS | Status: AC
  Filled 2017-02-16: qty 20

## 2017-02-16 MED ORDER — TETANUS-DIPHTH-ACELL PERTUSSIS 5-2.5-18.5 LF-MCG/0.5 IM SUSP: 0.5000 mL | Freq: Once | INTRAMUSCULAR | Status: DC

## 2017-02-16 MED ORDER — ZOLPIDEM TARTRATE 5 MG PO TABS: 5.0000 mg | ORAL_TABLET | Freq: Every evening | ORAL | Status: DC | PRN

## 2017-02-16 NOTE — Anesthesia Preprocedure Evaluation (Signed)
Anesthesia Evaluation  Patient identified by MRN, date of birth, ID band Patient awake    Reviewed: Allergy & Precautions, H&P , NPO status , Patient's Chart, lab work & pertinent test results  Airway Mallampati: I  TM Distance: >3 FB Neck ROM: full    Dental no notable dental hx.    Pulmonary neg pulmonary ROS,    Pulmonary exam normal        Cardiovascular negative cardio ROS Normal cardiovascular exam     Neuro/Psych negative neurological ROS  negative psych ROS   GI/Hepatic Neg liver ROS, GERD  Medicated and Controlled,  Endo/Other  negative endocrine ROS  Renal/GU negative Renal ROS     Musculoskeletal   Abdominal Normal abdominal exam  (+)   Peds  Hematology negative hematology ROS (+)   Anesthesia Other Findings   Reproductive/Obstetrics (+) Pregnancy                             Anesthesia Physical Anesthesia Plan  ASA: II  Anesthesia Plan: Epidural   Post-op Pain Management:    Induction:   Airway Management Planned:   Additional Equipment:   Intra-op Plan:   Post-operative Plan:   Informed Consent: I have reviewed the patients History and Physical, chart, labs and discussed the procedure including the risks, benefits and alternatives for the proposed anesthesia with the patient or authorized representative who has indicated his/her understanding and acceptance.     Plan Discussed with:   Anesthesia Plan Comments:         Anesthesia Quick Evaluation

## 2017-02-16 NOTE — Anesthesia Postprocedure Evaluation (Addendum)
Anesthesia Post Note  Patient: Adrienne Morris  Procedure(s) Performed: * No procedures listed *  Patient location during evaluation: Mother Baby Anesthesia Type: Epidural Level of consciousness: awake Pain management: pain level controlled Vital Signs Assessment: post-procedure vital signs reviewed and stable Respiratory status: spontaneous breathing Cardiovascular status: stable Postop Assessment: no headache, no backache, epidural receding and patient able to bend at knees Anesthetic complications: no Comments: Video interpreter used        Last Vitals:  Vitals:   02/16/17 1245 02/16/17 1315  BP: 113/67 113/62  Pulse: 80 72  Resp: 16 20  Temp:  36.7 C    Last Pain:  Vitals:   02/16/17 1315  TempSrc: Oral  PainSc: 0-No pain   Pain Goal: Patients Stated Pain Goal: 0 (02/16/17 0211)               Edison PaceWILKERSON,Devonte Migues

## 2017-02-16 NOTE — Anesthesia Procedure Notes (Signed)
Epidural Patient location during procedure: OB Start time: 02/16/2017 8:28 AM End time: 02/16/2017 8:40 AM  Staffing Anesthesiologist: Leilani AbleHATCHETT, Gricelda Foland Performed: anesthesiologist   Preanesthetic Checklist Completed: patient identified, surgical consent, pre-op evaluation, timeout performed, IV checked, risks and benefits discussed and monitors and equipment checked  Epidural Patient position: sitting Prep: site prepped and draped and DuraPrep Patient monitoring: continuous pulse ox and blood pressure Approach: midline Injection technique: LOR air  Needle:  Needle type: Tuohy  Needle gauge: 17 G Needle length: 9 cm and 9 Needle insertion depth: 5 cm cm Catheter type: closed end flexible Catheter size: 19 Gauge Catheter at skin depth: 10 cm Test dose: negative and Other  Assessment Sensory level: T10 Events: blood not aspirated, injection not painful, no injection resistance, negative IV test and no paresthesia  Additional Notes Reason for block:procedure for pain

## 2017-02-16 NOTE — Progress Notes (Signed)
Adrienne Morris is a 26 y.o. G2P0010 at 448w5d by admitted for PROM  Subjective:  Comfortable with epidural. No complaints at this time.  Objective: BP 112/67   Pulse 78   Temp 97.9 F (36.6 C) (Oral)   Resp 18   Ht 5\' 6"  (1.676 m)   Wt 159 lb (72.1 kg)   LMP 06/10/2016   SpO2 99%   BMI 25.66 kg/m  No intake/output data recorded. No intake/output data recorded.  FHT:  FHR:  12 bpm, variability: moderate,  accelerations:  Present,  decelerations:  Present variable UC:   irregular, every 3-6 minutes SVE:   Dilation: 5 Effacement (%): 90, 100 Station: -2, -1 Exam by:: Lorretta Harp. Brown RNC  Labs: Lab Results  Component Value Date   WBC 7.9 02/15/2017   HGB 11.3 (L) 02/15/2017   HCT 32.9 (L) 02/15/2017   MCV 83.7 02/15/2017   PLT 199 02/15/2017    Assessment / Plan: Augmentation of labor, progressing well  Labor: Progressing normally Preeclampsia:  NA Fetal Wellbeing:  Category I Pain Control:  Epidural I/D:  n/a Anticipated MOD:  NSVD  [name], [name] 02/16/2017, 10:29 AM

## 2017-02-16 NOTE — Anesthesia Pain Management Evaluation Note (Signed)
  CRNA Pain Management Visit Note  Patient: Adrienne Morris, 26 y.o., female  "Hello I am a member of the anesthesia team at [facility]. We have an anesthesia team available at all times to provide care throughout the hospital, including epidural management and anesthesia for C-section. I don't know your plan for the delivery whether it a natural birth, water birth, IV sedation, nitrous supplementation, doula or epidural, but we want to meet your pain goals."   1.Was your pain managed to your expectations on prior hospitalizations?   No   2.What is your expectation for pain management during this hospitalization?     Epidural  3.How can we help you reach that goal? Maintain epidural  Record the patient's initial score and the patient's pain goal.   Pain: 10, now  It is 0 post epidural placement.  Pain Goal: 0 [facility] wants you to be able to say your pain was always managed very well.  Ginette Bradway 02/16/2017

## 2017-02-17 DIAGNOSIS — Z3A37 37 weeks gestation of pregnancy: Secondary | ICD-10-CM

## 2017-02-17 LAB — TYPE AND SCREEN
Blood Product Expiration Date: 201803032359
Blood Product Expiration Date: 201803032359
ISSUE DATE / TIME: 201802221514
ISSUE DATE / TIME: 201802221514
UNIT TYPE AND RH: 600
Unit Type and Rh: 600

## 2017-02-17 LAB — RPR: RPR Ser Ql: NONREACTIVE

## 2017-02-17 NOTE — Discharge Instructions (Signed)
H??ng d?n ch?m [place] t?i nh dnh cho b m? (Home Care Instructions for Mom) HO?T ??NG  D?n tr? l?i t?t c? cc ho?t ??ng th??ng xuyn c?a qu v?.  ?? b?n thn qu v? ???c ngh? ng?i. Ch?p m?t khi con qu v? ng?.  Trnh nng b?t k? v?t g n?ng h?n 10 lb (4,5 kg) cho ??n khi chuyn gia ch?m [place] s?c kh?e ni c th? lm v?y.  Hessie Diener cc ho?t ??ng c?n nhi?u n? l?c v n?ng l??ng (?i h?i g?ng s?c) cho ??n khi chuyn gia ch?m [place] s?c kh?e ch?p thu?n. ?i l?i v?i t?c ?? ch?m ??n trung bnh th??ng l an ton.  N?u qu v? ?? m?:  Khng ht b?i, tro c?u thang ho?c li xe trong 4-6 tu?n.  Nh? ai ? gip qu v? ? nh cho ??n khi qu v? c?m th?y c th? t? lm cc vi?c bnh th??ng.  T?p th? d?c theo ch? d?n c?a chuyn gia ch?m [place] s?c kh?e, n?u ?i?u ny ???c p d?ng. CH?Y MU M ??O Qu v? c th? ti?p t?c ch?y mu trong 4-6 tu?n sau khi sinh con. Theo th?i gian, l??ng mu th??ng gi?m v mu mu s? nh?t h?n. Tuy nhin, dng mu mu ?? nh?t c th? t?ng ln n?u qu v? ho?t ??ng qu m?c. N?u qu v? c?n nhi?u h?n m?t b?ng v? sinh trong m?t gi? v b?ng b? th?m ??t, ho?c n?u qu v? ?i ti?u ra m?t c?c mu l?n:  N?m xu?ng.  Nng cao chn.  ??t b?ng p l?nh ln ph?n b?ng d??i c?a qu v?.  Ngh? ng?i.  G?i chuyn gia ch?m [place] s?c kh?e. N?u qu v? nui con b?ng s?a m?, qu v? s? c kinh nguy?t tr? l?i vo b?t k? lc no t? lc 8 tu?n sau khi sinh con ??n lc qu v? thi cho con b. N?u qu v? khng nui con b?ng s?a m?, qu v? s? c kinh nguy?t tr? l?i trong vng 6-8 tu?n sau khi sinh con. CH?M [place] VNG ?Y CH?U Vng ?y ch?u, ho?c ?y ch?u, l m?t b? ph?n c? th? n?m gi?a hai ?i c?a qu v?. Sau khi sinh con, vng ny c?n ch?m [place] ??c bi?t. Tun theo nh?ng h??ng d?n ny theo ch? d?n c?a chuyn gia ch?m [place] s?c kh?e.  T?m b?n n??c ?m trong 15-20 pht.  S? d?ng b?ng t?m thu?c v thu?c x?t v kem gi?m ?au theo ch? d?n.  Khng s? d?ng nt b?ng v? sinh ho?c th?t r?a cho ??n khi m ??o thi ch?y mu.  M?i l?n qu  v? vo nh t?m:  S? d?ng m?t chai phun n??c (peri bottle).  Thay b?ng v? sinh.  S? d?ng m?t kh?n ??t thay gi?y v? sinh cho ??n khi v?t khu c?a qu v? li?n l?i.  T?p bi t?p [place] m?i ngy. Bi t?p [place] gip duy tr cc c? h? tr? cho m ??o, bng quang v ru?t. Qu v? c th? t?p nh?ng bi t?p ny trong khi qu v? ??ng, ng?i hay n?m. ?? t?p cc bi t?p [place]:  Gi? ch?t cc c? ? b?ng v cc c? xung quanh ?ng d?n sinh c?a qu v?.  Gi? trong vi giy.  Th? l?ng.  L?p l?i cho ??n khi qu v? lm 5 l?n lin t?c.  ?? trnh b?nh tr? pht sinh ho?c n?ng thm:  U?ng ?? n??c ?? gi? cho n??c ti?u trong ho?c c mu vng nh?t.  Trnh r?n m?nh  khi ?i ??i ti?n.  Ch? s? d?ng thu?c khng c?n k ??n v thu?c lm m?m phn theo ch? d?n c?a chuyn gia ch?m [place] s?c kh?e. CH?M [place] NG?C  M?c o ng?c v?a kht.  Trnh dng thu?c gi?m ?au khng c?n k ??n ?? ?i?u tr? c?m gic kh ch?u ? ng?c.  Ch??m ? vo ng?c ?? gi?m c?m gic kh ch?u khi c?n thi?t:  Cho ? l?nh vo ti nh?a.  ?? kh?n t?m ? gi?a [place] v ti ch??m.  ?? ? l?nh trong kho?ng 20 pht ho?c theo ch? d?n c?a chuyn gia ch?m [place] s?c kh?e c?a qu v?. [facility] D??NG  p d?ng ch? ?? ?n u?ng cn b?ng.  Khng c? g?ng gi?m cn nhanh b?ng cch gi?m l??ng calo h?p th?.  U?ng vitamin dng tr??c khi sinh cho ??n khi khm s?c kh?e sau sinh ho?c cho ??n khi chuyn gia ch?m [place] s?c kh?e ni qu v? d?ng l?i. TR?M C?M SAU SINH Qu v? c th? th?y mnh khc m [employer] c nguyn nhn r rng v khng th? ??i ph v?i t?t c? nh?ng thay ??i x?y ra do m?i sinh con. Tm tr?ng ny ???c g?i l tr?m c?m sau khi sinh. Tr?m c?m sau khi sinh x?y ra v l??ng hoc mn c?a qu v? thay ??i sau khi sinh con. N?u qu v? b? tr?m c?m sau khi sinh, hy nh?n s? h? tr? c?a ch?ng, b?n b v gia ?nh. N?u tr?m c?m khng t? h?t sau vi tu?n, hy lin h? chuyn gia ch?m [place] s?c kh?e. T? KI?M TRA V T? ki?m tra v hng thng, vo cng th?i ?i?m trong thng. N?u qu v? cho con b s?a m?,  ki?m tra v ngay sau khi cho con b, khi v ? ?? c?ng. N?u qu v? cho con b s?a m? v qu v? b?t ??u c kinh nguy?t, hy ki?m tra v vo ngy 5, 6 ho?c 7 c?a k? kinh nguy?t. Bo co v? b?t k? kh?i u, c?c, hay ti?t d?ch no cho chuyn gia ch?m [place] s?c kh?e. Nn bi?t r?ng v th??ng c u c?c n?u qu v? ?ang cho con b. Vi?c ny l bnh th??ng v ? khng ph?i l m?t nguy c? v? s?c kh?e. Karle Plumber? THN M?T V TNH D?C Trnh quan h? tnh d?c trong t nh?t 3-4 tu?n sau khi sinh con ho?c cho ??n khi ch?t d?ch mu nu ?? ? m ??o h?t hon ton. N?u qu v? mu?n trnh [place], hy s? d?ng m?t s? d?ng trnh [place]. Qu v? c th? c thai sau khi sinh con, ngay c? khi qu v? ch?a th?y kinh nguy?t. ?I KHM N?U:  Qu v? c?m th?y khng th? ??i ph v?i nh?ng thay ??i m m?t ??a tr? mang l?i cho cu?c s?ng c?a qu v?, v nh?ng c?m gic ny khng h?t sau vi tu?n.  Qu v? th?y c u, c?c, ho?c ti?t d?ch ? v. NGAY L?P T?C ?I KHM N?U:  Mu lm ??t b?ng v? sinh trong th?i gian 1 gi? ho?c nhanh h?n.  Qu v? c:  ?au ho?c co th?t r?t nhi?u ? vng b?ng d??i.  Ti?t d?ch c mi kh ch?u ? m ??o.  S?t m khng ki?m sot ???c b?ng thu?c.  S?t v m?t vng trn v b? ?? v ?au.  ?au ho?c ?? ? b?p chn.  ?au ng?c ??t ng?t v r?t nhi?u.  Kh th?.  ?i ti?u ?au ho?c c mu.  Cc  v?n ?? v? th? l?c. °· Quý v? nôn trong 12 gi? ho?c lâu h?n. °· Quý v? b? ?au ??u d? d?i. °· Quý v? có suy ngh? nghiêm tr?ng v? vi?c t? làm t?n th??ng mình, con mình ho?c b?t k? ng??i nào khác. °Thông tin này không nh?m m?c ?ích thay th? cho l?i khuyên mà chuyên gia ch?m sóc s?c kh?e nói v?i quý v?. Hãy b?o ??m quý v? ph?i th?o lu?n b?t k? v?n ?? gì mà quý v? có v?i chuyên gia ch?m sóc s?c kh?e c?a quý v?. °Document Released: 06/02/2010 Document Revised: 04/05/2016 Document Reviewed: 06/16/2015 °Elsevier Interactive Patient Education © 2017 Elsevier Inc. ° °

## 2017-02-17 NOTE — Progress Notes (Signed)
[employer] interpreter # [phone] used for communication and teaching. Will continue to monitor pt.

## 2017-02-17 NOTE — Discharge Summary (Addendum)
Obstetric Discharge Summary Reason for Admission: SROM at 37 1/[redacted] weeks EGA Prenatal Procedures: ultrasound Intrapartum Procedures: spontaneous vaginal delivery Postpartum Procedures: none Complications-Operative and Postpartum: 2nd degree perineal laceration Hemoglobin  Date Value Ref Range Status  02/15/2017 11.3 (L) 12.0 - 15.0 g/dL Final   HCT  Date Value Ref Range Status  02/15/2017 32.9 (L) 36.0 - 46.0 % Final    Physical Exam:  General: alert Lochia: appropriate Uterine Fundus: firm at U-2 DVT Evaluation: No evidence of DVT seen on physical exam.  Discharge Diagnoses: Term Pregnancy-delivered  Discharge Information: Date: 02/17/2017 Activity: pelvic rest Diet: routine Medications: PNV and Ibuprofen Condition: stable Instructions: refer to practice specific booklet Discharge to: home  Contraception: condoms   Newborn Data: Live born female  Birth Weight: 6 lb 10.5 oz (3020 g) APGAR: 8, 9  Home with mother.  Finnis Colee C Darlena Koval 02/17/2017, 9:00 AM

## 2017-02-17 NOTE — Progress Notes (Signed)
Post Partum Day 1 Subjective: no complaints, up ad lib, voiding and tolerating PO  Objective: Blood pressure (!) 91/53, pulse 82, temperature 97.9 F (36.6 C), temperature source Oral, resp. rate 16, height 5\' 6"  (1.676 m), weight 72.1 kg (159 lb), last menstrual period 06/10/2016, SpO2 99 %, unknown if currently breastfeeding.  Physical Exam:  General: alert Lochia: appropriate Uterine Fundus: firm and NT at U-1 DVT Evaluation: No evidence of DVT seen on physical exam.   Recent Labs  02/15/17 2207  HGB 11.3*  HCT 32.9*    Assessment/Plan: Plan for discharge tomorrow   LOS: 2 days   Allie BossierMyra C Abdulkadir Emmanuel 02/17/2017, 6:58 AM

## 2017-02-18 MED ORDER — IBUPROFEN 600 MG PO TABS: 600.0000 mg | ORAL_TABLET | Freq: Four times a day (QID) | ORAL | 0 refills | Status: DC

## 2022-02-08 ENCOUNTER — Encounter (HOSPITAL_COMMUNITY): Payer: Self-pay | Admitting: Family Medicine

## 2022-02-08 ENCOUNTER — Inpatient Hospital Stay (HOSPITAL_COMMUNITY): Payer: Medicaid Other

## 2022-02-08 ENCOUNTER — Observation Stay (HOSPITAL_COMMUNITY)
Admission: AD | Admit: 2022-02-08 | Discharge: 2022-02-09 | Disposition: A | Discharge status: Home / Self Care | Payer: Medicaid Other | Attending: Obstetrics & Gynecology | Admitting: Obstetrics & Gynecology

## 2022-02-08 ENCOUNTER — Other Ambulatory Visit: Payer: Self-pay

## 2022-02-08 DIAGNOSIS — Z3A12 12 weeks gestation of pregnancy: Secondary | ICD-10-CM

## 2022-02-08 DIAGNOSIS — Z20822 Contact with and (suspected) exposure to covid-19: Secondary | ICD-10-CM | POA: Diagnosis not present

## 2022-02-08 DIAGNOSIS — O021 Missed abortion: Principal | ICD-10-CM | POA: Insufficient documentation

## 2022-02-08 DIAGNOSIS — D72829 Elevated white blood cell count, unspecified: Secondary | ICD-10-CM | POA: Diagnosis present

## 2022-02-08 DIAGNOSIS — R109 Unspecified abdominal pain: Secondary | ICD-10-CM | POA: Diagnosis present

## 2022-02-08 DIAGNOSIS — O26891 Other specified pregnancy related conditions, first trimester: Secondary | ICD-10-CM | POA: Diagnosis present

## 2022-02-08 DIAGNOSIS — O039 Complete or unspecified spontaneous abortion without complication: Secondary | ICD-10-CM | POA: Diagnosis not present

## 2022-02-08 DIAGNOSIS — Z3491 Encounter for supervision of normal pregnancy, unspecified, first trimester: Secondary | ICD-10-CM

## 2022-02-08 DIAGNOSIS — O209 Hemorrhage in early pregnancy, unspecified: Secondary | ICD-10-CM | POA: Diagnosis present

## 2022-02-08 DIAGNOSIS — A749 Chlamydial infection, unspecified: Secondary | ICD-10-CM | POA: Insufficient documentation

## 2022-02-08 DIAGNOSIS — R509 Fever, unspecified: Secondary | ICD-10-CM | POA: Diagnosis present

## 2022-02-08 HISTORY — DX: Chlamydial infection, unspecified: A74.9

## 2022-02-08 LAB — COMPREHENSIVE METABOLIC PANEL
ALT: 11 U/L (ref 0–44)
AST: 16 U/L (ref 15–41)
Albumin: 3.5 g/dL (ref 3.5–5.0)
Alkaline Phosphatase: 49 U/L (ref 38–126)
Anion gap: 9 (ref 5–15)
BUN: 8 mg/dL (ref 6–20)
CO2: 20 mmol/L — ABNORMAL LOW (ref 22–32)
Calcium: 8.7 mg/dL — ABNORMAL LOW (ref 8.9–10.3)
Chloride: 101 mmol/L (ref 98–111)
Creatinine, Ser: 0.52 mg/dL (ref 0.44–1.00)
GFR, Estimated: >60 mL/min (ref >60)
Glucose, Bld: 88 mg/dL (ref 70–99)
Potassium: 3.2 mmol/L — ABNORMAL LOW (ref 3.5–5.1)
Sodium: 130 mmol/L — ABNORMAL LOW (ref 135–145)
Total Bilirubin: 0.5 mg/dL (ref 0.3–1.2)
Total Protein: 7.3 g/dL (ref 6.5–8.1)

## 2022-02-08 LAB — URINALYSIS, ROUTINE W REFLEX MICROSCOPIC
Bilirubin Urine: NEGATIVE
Glucose, UA: NEGATIVE mg/dL
Hgb urine dipstick: NEGATIVE
Ketones, ur: 80 mg/dL — AB
Leukocytes,Ua: NEGATIVE
Nitrite: NEGATIVE
Protein, ur: NEGATIVE mg/dL
Specific Gravity, Urine: 1.024 (ref 1.005–1.030)
pH: 6 (ref 5.0–8.0)

## 2022-02-08 LAB — CBC
HCT: 34 % — ABNORMAL LOW (ref 36.0–46.0)
Hemoglobin: 11.4 g/dL — ABNORMAL LOW (ref 12.0–15.0)
MCH: 27.9 pg (ref 26.0–34.0)
MCHC: 33.5 g/dL (ref 30.0–36.0)
MCV: 83.1 fL (ref 80.0–100.0)
Platelets: 243 10*3/uL (ref 150–400)
RBC: 4.09 MIL/uL (ref 3.87–5.11)
RDW: 14.4 % (ref 11.5–15.5)
WBC: 19.7 10*3/uL — ABNORMAL HIGH (ref 4.0–10.5)
nRBC: 0 % (ref 0.0–0.2)

## 2022-02-08 LAB — ABO/RH: ABO/RH(D): A POS

## 2022-02-08 LAB — WET PREP, GENITAL
Clue Cells Wet Prep HPF POC: NONE SEEN
Sperm: NONE SEEN
Trich, Wet Prep: NONE SEEN
WBC, Wet Prep HPF POC: <10 (ref <10)
Yeast Wet Prep HPF POC: NONE SEEN

## 2022-02-08 LAB — RESP PANEL BY RT-PCR (FLU A&B, COVID) ARPGX2
Influenza A by PCR: NEGATIVE
Influenza B by PCR: NEGATIVE
SARS Coronavirus 2 by RT PCR: NEGATIVE

## 2022-02-08 LAB — TYPE AND SCREEN
ABO/RH(D): A POS
Antibody Screen: NEGATIVE

## 2022-02-08 LAB — LACTIC ACID, PLASMA: Lactic Acid, Venous: 0.9 mmol/L (ref 0.5–1.9)

## 2022-02-08 LAB — HCG, QUANTITATIVE, PREGNANCY: hCG, Beta Chain, Quant, S: 85501 m[IU]/mL — ABNORMAL HIGH (ref <5)

## 2022-02-08 MED ORDER — DOCUSATE SODIUM 100 MG PO CAPS
100.0000 mg | ORAL_CAPSULE | Freq: Every day | ORAL | Status: DC
  Administered 2022-02-09: 100 mg via ORAL
  Filled 2022-02-08 (×2): qty 1

## 2022-02-08 MED ORDER — POTASSIUM CHLORIDE 2 MEQ/ML IV SOLN
INTRAVENOUS | Status: DC
  Filled 2022-02-08 (×2): qty 1000

## 2022-02-08 MED ORDER — HYDROMORPHONE HCL 1 MG/ML IJ SOLN
0.5000 mg | INTRAMUSCULAR | Status: DC | PRN
  Administered 2022-02-08 – 2022-02-09 (×3): 0.5 mg via INTRAVENOUS
  Filled 2022-02-08 (×3): qty 0.5

## 2022-02-08 MED ORDER — ACETAMINOPHEN 325 MG PO TABS: 650.0000 mg | ORAL_TABLET | ORAL | Status: DC | PRN

## 2022-02-08 MED ORDER — CALCIUM CARBONATE ANTACID 500 MG PO CHEW: 2.0000 | CHEWABLE_TABLET | ORAL | Status: DC | PRN

## 2022-02-08 MED ORDER — ZOLPIDEM TARTRATE 5 MG PO TABS: 5.0000 mg | ORAL_TABLET | Freq: Every evening | ORAL | Status: DC | PRN

## 2022-02-08 MED ORDER — LACTATED RINGERS IV BOLUS
1000.0000 mL | Freq: Once | INTRAVENOUS | Status: AC
  Administered 2022-02-08: 1000 mL via INTRAVENOUS

## 2022-02-08 MED ORDER — BISACODYL 5 MG PO TBEC: 5.0000 mg | DELAYED_RELEASE_TABLET | Freq: Every day | ORAL | Status: DC | PRN

## 2022-02-08 MED ORDER — HYDROMORPHONE HCL 1 MG/ML IJ SOLN
1.0000 mg | Freq: Once | INTRAMUSCULAR | Status: AC
  Administered 2022-02-08: 1 mg via INTRAVENOUS
  Filled 2022-02-08: qty 1

## 2022-02-08 MED ORDER — LACTATED RINGERS IV SOLN: INTRAVENOUS | Status: DC

## 2022-02-08 MED ORDER — PRENATAL MULTIVITAMIN CH: 1.0000 | ORAL_TABLET | Freq: Every day | ORAL | Status: DC

## 2022-02-08 MED ORDER — ACETAMINOPHEN 500 MG PO TABS
1000.0000 mg | ORAL_TABLET | Freq: Once | ORAL | Status: AC
  Administered 2022-02-08: 1000 mg via ORAL
  Filled 2022-02-08: qty 2

## 2022-02-08 NOTE — MAU Note (Signed)
Pt bak From MRI. Reports abd pain is starting to get worse 7/10.

## 2022-02-08 NOTE — MAU Provider Note (Signed)
History     CSN: [id number]  Arrival date and time: 02/08/22 1310   Event Date/Time   First Provider Initiated Contact with Patient 02/08/22 1314      Chief Complaint  Patient presents with   Abdominal Pain   Vaginal Bleeding   HPI Adrienne Morris is a 31 y.o. G3P1011 at 7152w6d by LMP who presents via EMS with abdominal pain & vaginal bleeding. Initially went to urgent care where they called 911 due to amount of pain patient was in.  Abdominal pain started last night. Suprapubic sharp cramping pains that she rates 9/10. Hasn't treated symptoms. Pain is constant. Started bleeding this morning. Can't tell how much but reportedly got heavier while on ambulance en route to MAU. Reports hx of ectopic pregnancy 9 years ago that was treated with surgery. Unsure which side.   OB History     Gravida  3   Para  1   Term  1   Preterm      AB  1   Living  1      SAB      IAB      Ectopic  1   Multiple  0   Live Births  1           Past Medical History:  Diagnosis Date   Medical history non-contributory     Past Surgical History:  Procedure Laterality Date   ECTOPIC PREGNANCY SURGERY      No family history on file.  Social History   Tobacco Use   Smoking status: Never   Smokeless tobacco: Never  Substance Use Topics   Alcohol use: No   Drug use: No    Allergies: No Known Allergies  Medications Prior to Admission  Medication Sig Dispense Refill Last Dose   docusate sodium (COLACE) 100 MG capsule Take 100 mg by mouth daily as needed for mild constipation. (Patient not taking: Reported on 02/08/2022)   Not Taking   ibuprofen (ADVIL,MOTRIN) 600 MG tablet Take 1 tablet (600 mg total) by mouth every 6 (six) hours. 30 tablet 0    pantoprazole (PROTONIX) 20 MG tablet TAKE 1 TABLET(20 MG) BY MOUTH DAILY 30 tablet 0    Prenatal Vit-Fe Fumarate-FA (PRENATAL VITAMIN PO) Take 1 tablet by mouth daily.        Review of Systems  Constitutional: Negative.    Gastrointestinal:  Positive for abdominal pain. Negative for nausea and vomiting.  Genitourinary:  Positive for vaginal bleeding.  Physical Exam   Blood pressure (!) 109/59, pulse 94, temperature 100.2 F (37.9 C), resp. rate 18, last menstrual period 12/15/2021, unknown if currently breastfeeding.  Physical Exam Vitals and nursing note reviewed. Exam conducted with a chaperone present.  Constitutional:      General: She is in acute distress.     Appearance: She is well-developed. She is not toxic-appearing or diaphoretic.  HENT:     Head: Normocephalic and atraumatic.  Pulmonary:     Effort: Pulmonary effort is normal. No respiratory distress.  Abdominal:     General: Abdomen is flat.     Palpations: Abdomen is soft.     Tenderness: There is abdominal tenderness in the right lower quadrant, suprapubic area and left lower quadrant. There is no guarding or rebound.  Genitourinary:    Uterus: Enlarged.      Comments: Pelvic: NEFG, scant dark red mucoid blood. No active bleeding. Cervix anterior.   Cervix closed Skin:    General: Skin is warm and  dry.  Neurological:     General: No focal deficit present.     Mental Status: She is alert.  Psychiatric:        Mood and Affect: Mood normal.        Behavior: Behavior normal.    MAU Course  Procedures Results for orders placed or performed during the hospital encounter of 02/08/22 (from the past 24 hour(s))  CBC     Status: Abnormal   Collection Time: 02/08/22  1:14 PM  Result Value Ref Range   WBC 19.7 (H) 4.0 - 10.5 K/uL   RBC 4.09 3.87 - 5.11 MIL/uL   Hemoglobin 11.4 (L) 12.0 - 15.0 g/dL   HCT 47.4 (L) 25.9 - 56.3 %   MCV 83.1 80.0 - 100.0 fL   MCH 27.9 26.0 - 34.0 pg   MCHC 33.5 30.0 - 36.0 g/dL   RDW 87.5 64.3 - 32.9 %   Platelets 243 150 - 400 K/uL   nRBC 0.0 0.0 - 0.2 %  hCG, quantitative, pregnancy     Status: Abnormal   Collection Time: 02/08/22  1:14 PM  Result Value Ref Range   hCG, Beta Chain, Quant, S  85,501 (H) <5 mIU/mL  Comprehensive metabolic panel     Status: Abnormal   Collection Time: 02/08/22  1:14 PM  Result Value Ref Range   Sodium 130 (L) 135 - 145 mmol/L   Potassium 3.2 (L) 3.5 - 5.1 mmol/L   Chloride 101 98 - 111 mmol/L   CO2 20 (L) 22 - 32 mmol/L   Glucose, Bld 88 70 - 99 mg/dL   BUN 8 6 - 20 mg/dL   Creatinine, Ser 5.18 0.44 - 1.00 mg/dL   Calcium 8.7 (L) 8.9 - 10.3 mg/dL   Total Protein 7.3 6.5 - 8.1 g/dL   Albumin 3.5 3.5 - 5.0 g/dL   AST 16 15 - 41 U/L   ALT 11 0 - 44 U/L   Alkaline Phosphatase 49 38 - 126 U/L   Total Bilirubin 0.5 0.3 - 1.2 mg/dL   GFR, Estimated >84 >16 mL/min   Anion gap 9 5 - 15  ABO/Rh     Status: None   Collection Time: 02/08/22  1:33 PM  Result Value Ref Range   ABO/RH(D) A POS    No rh immune globuloin      NOT A RH IMMUNE GLOBULIN CANDIDATE, PT RH POSITIVE Performed at [facility], [address], [place], [place] [address]   Wet prep, genital     Status: None   Collection Time: 02/08/22  1:56 PM   Specimen: PATH Cytology Cervicovaginal Ancillary Only  Result Value Ref Range   Yeast Wet Prep HPF POC NONE SEEN NONE SEEN   Trich, Wet Prep NONE SEEN NONE SEEN   Clue Cells Wet Prep HPF POC NONE SEEN NONE SEEN   WBC, Wet Prep HPF POC <10 <10   Sperm NONE SEEN   Urinalysis, Routine w reflex microscopic Urine, In & Out Cath     Status: Abnormal   Collection Time: 02/08/22  2:06 PM  Result Value Ref Range   Color, Urine YELLOW YELLOW   APPearance CLEAR CLEAR   Specific Gravity, Urine 1.024 1.005 - 1.030   pH 6.0 5.0 - 8.0   Glucose, UA NEGATIVE NEGATIVE mg/dL   Hgb urine dipstick NEGATIVE NEGATIVE   Bilirubin Urine NEGATIVE NEGATIVE   Ketones, ur 80 (A) NEGATIVE mg/dL   Protein, ur NEGATIVE NEGATIVE mg/dL  Nitrite NEGATIVE NEGATIVE   Leukocytes,Ua NEGATIVE NEGATIVE  Lactic acid, plasma     Status: None   Collection Time: 02/08/22  2:09 PM  Result Value Ref Range   Lactic Acid, Venous 0.9 0.5 - 1.9 mmol/L  Resp Panel  by RT-PCR (Flu A&B, Covid) Nasopharyngeal Swab     Status: None   Collection Time: 02/08/22  2:55 PM   Specimen: Nasopharyngeal Swab; Nasopharyngeal(NP) swabs in vial transport medium  Result Value Ref Range   SARS Coronavirus 2 by RT PCR NEGATIVE NEGATIVE   Influenza A by PCR NEGATIVE NEGATIVE   Influenza B by PCR NEGATIVE NEGATIVE   MR PELVIS WO CONTRAST  Result Date: 02/08/2022 CLINICAL DATA:  Right lower quadrant abdominal pain. Twelve weeks pregnant. EXAM: MRI ABDOMEN AND PELVIS WITHOUT CONTRAST TECHNIQUE: Multiplanar multisequence MR imaging of the abdomen and pelvis was performed. No intravenous contrast was administered. COMPARISON:  Today's obstetric ultrasound. FINDINGS: COMBINED FINDINGS FOR BOTH MR ABDOMEN AND PELVIS Lower chest: Normal heart size without pericardial or pleural effusion. Hepatobiliary: Normal noncontrast appearance of the liver, gallbladder, biliary tract. Pancreas:  Normal, without mass or ductal dilatation. Spleen:  Normal in size, without focal abnormality. Adrenals/Urinary Tract: Normal adrenal glands. Normal kidneys, without hydronephrosis. Normal urinary bladder. Stomach/Bowel: Normal stomach and abdominal small bowel loops. Colonic stool burden suggests constipation. The appendix originates on 05/12 and is positioned retrocecal , including on 01/12 and 13/3. No evidence of appendicitis. Vascular/Lymphatic: Normal aortic and branch vessel caliber. No abdominopelvic adenopathy. Reproductive: Intrauterine pregnancy. The left ovary demonstrates a 11 mm probable corpus luteal cyst on 28/3. The right ovary is likely identified just caudal the cecal tip including on 10/17. Other: Trace free pelvic fluid is likely physiologic. No abdominal ascites. Musculoskeletal: No acute osseous abnormality. IMPRESSION: 1. Normal appendix, without specific explanation for right lower quadrant pain. 2. Probable left ovarian corpus luteal cyst. 3.  Possible constipation. Electronically Signed    By: Jeronimo Greaves M.D.   On: 02/08/2022 17:25   MR ABDOMEN WO CONTRAST  Result Date: 02/08/2022 CLINICAL DATA:  Right lower quadrant abdominal pain. Twelve weeks pregnant. EXAM: MRI ABDOMEN AND PELVIS WITHOUT CONTRAST TECHNIQUE: Multiplanar multisequence MR imaging of the abdomen and pelvis was performed. No intravenous contrast was administered. COMPARISON:  Today's obstetric ultrasound. FINDINGS: COMBINED FINDINGS FOR BOTH MR ABDOMEN AND PELVIS Lower chest: Normal heart size without pericardial or pleural effusion. Hepatobiliary: Normal noncontrast appearance of the liver, gallbladder, biliary tract. Pancreas:  Normal, without mass or ductal dilatation. Spleen:  Normal in size, without focal abnormality. Adrenals/Urinary Tract: Normal adrenal glands. Normal kidneys, without hydronephrosis. Normal urinary bladder. Stomach/Bowel: Normal stomach and abdominal small bowel loops. Colonic stool burden suggests constipation. The appendix originates on 05/12 and is positioned retrocecal , including on 01/12 and 13/3. No evidence of appendicitis. Vascular/Lymphatic: Normal aortic and branch vessel caliber. No abdominopelvic adenopathy. Reproductive: Intrauterine pregnancy. The left ovary demonstrates a 11 mm probable corpus luteal cyst on 28/3. The right ovary is likely identified just caudal the cecal tip including on 10/17. Other: Trace free pelvic fluid is likely physiologic. No abdominal ascites. Musculoskeletal: No acute osseous abnormality. IMPRESSION: 1. Normal appendix, without specific explanation for right lower quadrant pain. 2. Probable left ovarian corpus luteal cyst. 3.  Possible constipation. Electronically Signed   By: Jeronimo Greaves M.D.   On: 02/08/2022 17:25   US OB LESS THAN 14 WEEKS WITH OB TRANSVAGINAL  Result Date: 02/08/2022 CLINICAL DATA:  Vaginal bleeding, lower abdominal pain. EXAM: OBSTETRIC <14 WK  [place] AND TRANSVAGINAL OB US TECHNIQUE: Both transabdominal and transvaginal ultrasound  examinations were performed for complete evaluation of the gestation as well as the maternal uterus, adnexal regions, and pelvic cul-de-sac. Transvaginal technique was performed to assess early pregnancy. COMPARISON:  None. FINDINGS: Intrauterine gestational sac: Single Yolk sac:  Not Visualized. Embryo:  Visualized. Cardiac Activity: Visualized. Heart Rate: 193 bpm CRL:  55.1 mm   12 w   1 d                  [place] EDC: August 22, 2022. Subchorionic hemorrhage:  None visualized. Maternal uterus/adnexae: Right ovary is not visualized. Left ovary appears normal. No free fluid is noted. IMPRESSION: Single live intrauterine gestation of 12 weeks 1 day. Electronically Signed   By: Lupita Raider M.D.   On: 02/08/2022 14:31    MDM Patient presents with significant abdominal pain in early pregnancy. Hx of ectopic pregnancy 9 years ago. Initially worked up for ectopic pregnancy based on presentation but ultrasound today shows IUP measuring [redacted]w[redacted]d (EDD updated) with cardiac activity.   Elevated WBC at 19 & temp is 100.3. She denies any symptoms other than lower abdominal pain & vaginal bleeding. Her u/a is negative. Other than some mild electrolyte changes, her CMP & lactic acid are normal.  MRI was ordered, per consult with Dr. Adrian Blackwater, to evaluate for appendicitis & incarcerated uterus. MRI showed some constipation, otherwise normal. Patient reports last BM was 4 days ago which is a normal interval for her since prior to pregnancy.   Was initially given IV dilaudid which helped with pain but pain returned & patient is now very uncomfortable again. Per Dr. Adrian Blackwater, will obs due to leukocytosis & pain. Will plan to repeat CBC in the morning. Blood culture & urine culture ordered.   Patient's primary language is [place] but she declines an interpreter. She verbalizes understanding of our conversation & answers my questions appropriately.   Assessment and Plan   1. Fever of unknown origin   2. Vaginal bleeding  in pregnancy, first trimester   3. Abdominal pain during pregnancy in first trimester   4. Normal IUP (intrauterine pregnancy) on prenatal ultrasound, first trimester   5. [redacted] weeks gestation of pregnancy     -obs on OBSC unit -blood culture & urine culture pending -repeat CBC in the morning  Judeth Horn 02/08/2022, 1:23 PM

## 2022-02-08 NOTE — MAU Note (Signed)
EMS arrival. Pt started having abd pain last night. Went to urgent care this today. Urgent care called EMS due to pt pain level and she started having vaginal bleeding. LMP 12/15/2021. Pt did not know she was pregnant until today.

## 2022-02-08 NOTE — MAU Note (Addendum)
Pt. off floor to go to MRI.

## 2022-02-09 ENCOUNTER — Observation Stay (HOSPITAL_COMMUNITY): Payer: Medicaid Other

## 2022-02-09 DIAGNOSIS — D72829 Elevated white blood cell count, unspecified: Secondary | ICD-10-CM | POA: Diagnosis present

## 2022-02-09 DIAGNOSIS — O26891 Other specified pregnancy related conditions, first trimester: Secondary | ICD-10-CM | POA: Diagnosis present

## 2022-02-09 DIAGNOSIS — O039 Complete or unspecified spontaneous abortion without complication: Secondary | ICD-10-CM | POA: Diagnosis not present

## 2022-02-09 DIAGNOSIS — O209 Hemorrhage in early pregnancy, unspecified: Secondary | ICD-10-CM | POA: Diagnosis present

## 2022-02-09 DIAGNOSIS — R509 Fever, unspecified: Secondary | ICD-10-CM

## 2022-02-09 DIAGNOSIS — O021 Missed abortion: Secondary | ICD-10-CM | POA: Diagnosis not present

## 2022-02-09 LAB — CBC
HCT: 29.3 % — ABNORMAL LOW (ref 36.0–46.0)
Hemoglobin: 10.1 g/dL — ABNORMAL LOW (ref 12.0–15.0)
MCH: 28.4 pg (ref 26.0–34.0)
MCHC: 34.5 g/dL (ref 30.0–36.0)
MCV: 82.3 fL (ref 80.0–100.0)
Platelets: 194 10*3/uL (ref 150–400)
RBC: 3.56 MIL/uL — ABNORMAL LOW (ref 3.87–5.11)
RDW: 14.6 % (ref 11.5–15.5)
WBC: 23.6 10*3/uL — ABNORMAL HIGH (ref 4.0–10.5)
nRBC: 0 % (ref 0.0–0.2)

## 2022-02-09 MED ORDER — AMOXICILLIN-POT CLAVULANATE 875-125 MG PO TABS: 1.0000 | ORAL_TABLET | Freq: Two times a day (BID) | ORAL | 0 refills | Status: AC

## 2022-02-09 MED ORDER — SODIUM CHLORIDE 0.9 % IV SOLN
2.0000 g | INTRAVENOUS | Status: DC
  Administered 2022-02-09: 2 g via INTRAVENOUS
  Filled 2022-02-09: qty 20

## 2022-02-09 MED ORDER — DOCUSATE SODIUM 100 MG PO CAPS: 100.0000 mg | ORAL_CAPSULE | Freq: Two times a day (BID) | ORAL | 2 refills | Status: AC | PRN

## 2022-02-09 MED ORDER — IBUPROFEN 800 MG PO TABS: 800.0000 mg | ORAL_TABLET | Freq: Three times a day (TID) | ORAL | 2 refills | Status: DC | PRN

## 2022-02-09 MED ORDER — METHYLERGONOVINE MALEATE 0.2 MG PO TABS
0.2000 mg | ORAL_TABLET | Freq: Once | ORAL | Status: AC
  Administered 2022-02-09: 0.2 mg via ORAL
  Filled 2022-02-09: qty 1

## 2022-02-09 MED ORDER — OXYCODONE HCL 5 MG PO TABS
5.0000 mg | ORAL_TABLET | ORAL | 0 refills | Status: DC | PRN
Start: 2022-02-09 — End: 2024-07-18

## 2022-02-09 MED ORDER — AZITHROMYCIN 250 MG PO TABS
1000.0000 mg | ORAL_TABLET | Freq: Once | ORAL | Status: AC
  Administered 2022-02-09: 1000 mg via ORAL
  Filled 2022-02-09: qty 4

## 2022-02-09 MED ORDER — METRONIDAZOLE 500 MG PO TABS
500.0000 mg | ORAL_TABLET | Freq: Two times a day (BID) | ORAL | Status: DC
  Administered 2022-02-09: 500 mg via ORAL
  Filled 2022-02-09: qty 1

## 2022-02-09 MED ORDER — KETOROLAC TROMETHAMINE 30 MG/ML IJ SOLN
30.0000 mg | Freq: Four times a day (QID) | INTRAMUSCULAR | Status: DC | PRN
  Administered 2022-02-09: 30 mg via INTRAVENOUS
  Filled 2022-02-09: qty 1

## 2022-02-09 NOTE — Progress Notes (Signed)
° ° °  Faculty Practice OB/GYN Attending Note  US PELVIS (TRANSABDOMINAL ONLY)  Result Date: 02/09/2022 CLINICAL DATA:  Spontaneous abortion EXAM: TRANSABDOMINAL ULTRASOUND OF PELVIS TECHNIQUE: Transabdominal ultrasound examination of the pelvis was performed including evaluation of the uterus, ovaries, adnexal regions, and pelvic cul-de-sac. COMPARISON:  Pelvic ultrasound 02/08/2022 FINDINGS: Uterus Measurements: 10.8 x 12.7 x 7.4 cm = volume: 527 mL. No fibroids or other mass visualized. Endometrium Thickness: Previous intrauterine gestational sac and fetal pole are no longer visualized consistent with history of spontaneous abortion. The endometrium is thickened and inhomogeneous including an asymmetrically thickened area anteriorly which demonstrates mildly increased color Doppler flow. Right ovary Measurements: Mild visualized Left ovary Measurements: 3.9 x 2.3 x 2.7 cm = volume: 12.5 mL. Normal appearance/no adnexal mass. Other findings:  No abnormal free fluid. IMPRESSION: Interval spontaneous abortion. Thickened inhomogeneous endometrium including asymmetrically thickened area anteriorly which demonstrates mildly increased color Doppler flow, possibly retained products of conception. Electronically Signed   By: Jannifer Hick M.D.   On: 02/09/2022 15:40   Discussed results with Dr.Delaney Mayford Knife, measured area of concern is less than 1.5 cm in thickness and not very vascular, less concern about products of conception needing intervention.  Patient is having minimal bleeding.  She will be discharged home with bleeding precautions and plan for outpatient follow up.    Jaynie Collins, MD, FACOG Obstetrician & Gynecologist, [facility] for [employer], [facility]

## 2022-02-09 NOTE — Progress Notes (Signed)
Patient ID: Adrienne Morris, female   DOB: [birth year], 31 y.o.   MRN: [id number] FACULTY PRACTICE ANTEPARTUM(COMPREHENSIVE) NOTE  Adrienne Morris is a 31 y.o. G3P1011 at [redacted]w[redacted]d by early ultrasound who is admitted for pelvic pain and vaginal bleeding.   Fetal presentation is unsure. Length of Stay:  0  Days  Subjective: Cramps and vaginal bleeding Patient reports the fetal movement as  absent .  Patient describes fluid per vagina as None.  Vitals:  Blood pressure (!) 98/48, pulse 85, temperature 98.4 F (36.9 C), temperature source Oral, resp. rate 18, last menstrual period 12/15/2021, SpO2 100 %, unknown if currently breastfeeding. Physical Examination:  General appearance - alert, well appearing, and in no distress and quiet, appeared mildly sedated after dilaudid Heart - normal rate and regular rhythm Abdomen - soft, nontender, nondistended Fundal Height:  size equals dates Cervical Exam: Not evaluated. . Extremities: extremities normal, atraumatic, no cyanosis or edema and Homans sign is negative, no sign of DVT  Membranes:intact Blood clot on pad and in toilet Fetal Monitoring:  FHT 150  Labs:  Results for orders placed or performed during the hospital encounter of 02/08/22 (from the past 24 hour(s))  CBC   Collection Time: 02/08/22  1:14 PM  Result Value Ref Range   WBC 19.7 (H) 4.0 - 10.5 K/uL   RBC 4.09 3.87 - 5.11 MIL/uL   Hemoglobin 11.4 (L) 12.0 - 15.0 g/dL   HCT 22.9 (L) 79.8 - 92.1 %   MCV 83.1 80.0 - 100.0 fL   MCH 27.9 26.0 - 34.0 pg   MCHC 33.5 30.0 - 36.0 g/dL   RDW 19.4 17.4 - 08.1 %   Platelets 243 150 - 400 K/uL   nRBC 0.0 0.0 - 0.2 %  hCG, quantitative, pregnancy   Collection Time: 02/08/22  1:14 PM  Result Value Ref Range   hCG, Beta Chain, Quant, S 85,501 (H) <5 mIU/mL  Comprehensive metabolic panel   Collection Time: 02/08/22  1:14 PM  Result Value Ref Range   Sodium 130 (L) 135 - 145 mmol/L   Potassium 3.2 (L) 3.5 - 5.1 mmol/L   Chloride 101 98 - 111 mmol/L    CO2 20 (L) 22 - 32 mmol/L   Glucose, Bld 88 70 - 99 mg/dL   BUN 8 6 - 20 mg/dL   Creatinine, Ser 4.48 0.44 - 1.00 mg/dL   Calcium 8.7 (L) 8.9 - 10.3 mg/dL   Total Protein 7.3 6.5 - 8.1 g/dL   Albumin 3.5 3.5 - 5.0 g/dL   AST 16 15 - 41 U/L   ALT 11 0 - 44 U/L   Alkaline Phosphatase 49 38 - 126 U/L   Total Bilirubin 0.5 0.3 - 1.2 mg/dL   GFR, Estimated >18 >56 mL/min   Anion gap 9 5 - 15  ABO/Rh   Collection Time: 02/08/22  1:33 PM  Result Value Ref Range   ABO/RH(D) A POS    No rh immune globuloin      NOT A RH IMMUNE GLOBULIN CANDIDATE, PT RH POSITIVE Performed at [facility], [address] [place], [place] [address]   Wet prep, genital   Collection Time: 02/08/22  1:56 PM   Specimen: PATH Cytology Cervicovaginal Ancillary Only  Result Value Ref Range   Yeast Wet Prep HPF POC NONE SEEN NONE SEEN   Trich, Wet Prep NONE SEEN NONE SEEN   Clue Cells Wet Prep HPF POC NONE SEEN NONE SEEN   WBC, Wet Prep HPF  POC <10 <10   Sperm NONE SEEN   Urinalysis, Routine w reflex microscopic Urine, In & Out Cath   Collection Time: 02/08/22  2:06 PM  Result Value Ref Range   Color, Urine YELLOW YELLOW   APPearance CLEAR CLEAR   Specific Gravity, Urine 1.024 1.005 - 1.030   pH 6.0 5.0 - 8.0   Glucose, UA NEGATIVE NEGATIVE mg/dL   Hgb urine dipstick NEGATIVE NEGATIVE   Bilirubin Urine NEGATIVE NEGATIVE   Ketones, ur 80 (A) NEGATIVE mg/dL   Protein, ur NEGATIVE NEGATIVE mg/dL   Nitrite NEGATIVE NEGATIVE   Leukocytes,Ua NEGATIVE NEGATIVE  Lactic acid, plasma   Collection Time: 02/08/22  2:09 PM  Result Value Ref Range   Lactic Acid, Venous 0.9 0.5 - 1.9 mmol/L  Resp Panel by RT-PCR (Flu A&B, Covid) Nasopharyngeal Swab   Collection Time: 02/08/22  2:55 PM   Specimen: Nasopharyngeal Swab; Nasopharyngeal(NP) swabs in vial transport medium  Result Value Ref Range   SARS Coronavirus 2 by RT PCR NEGATIVE NEGATIVE   Influenza A by PCR NEGATIVE NEGATIVE   Influenza B by PCR NEGATIVE  NEGATIVE  Culture, blood (Routine X 2) w Reflex to ID Panel   Collection Time: 02/08/22  6:28 PM   Specimen: BLOOD  Result Value Ref Range   Specimen Description BLOOD RIGHT ARM    Special Requests      BOTTLES DRAWN AEROBIC AND ANAEROBIC Blood Culture adequate volume   Culture      NO GROWTH < 12 HOURS Performed at [facility], [address], [place], [place] [address]    Report Status PENDING   Culture, blood (Routine X 2) w Reflex to ID Panel   Collection Time: 02/08/22  6:34 PM   Specimen: BLOOD  Result Value Ref Range   Specimen Description BLOOD LEFT ARM    Special Requests      BOTTLES DRAWN AEROBIC AND ANAEROBIC Blood Culture adequate volume   Culture      NO GROWTH < 12 HOURS Performed at [facility], [address], [place], [place] [address]    Report Status PENDING   Type and screen [facility]   Collection Time: 02/08/22  7:40 PM  Result Value Ref Range   ABO/RH(D) A POS    Antibody Screen NEG    Sample Expiration      02/11/2022,2359 Performed at [facility], [address] [place], [place] [address]   CBC   Collection Time: 02/09/22  4:42 AM  Result Value Ref Range   WBC 23.6 (H) 4.0 - 10.5 K/uL   RBC 3.56 (L) 3.87 - 5.11 MIL/uL   Hemoglobin 10.1 (L) 12.0 - 15.0 g/dL   HCT 81.4 (L) 48.1 - 85.6 %   MCV 82.3 80.0 - 100.0 fL   MCH 28.4 26.0 - 34.0 pg   MCHC 34.5 30.0 - 36.0 g/dL   RDW 31.4 97.0 - 26.3 %   Platelets 194 150 - 400 K/uL   nRBC 0.0 0.0 - 0.2 %     Medications:  Scheduled  docusate sodium  100 mg Oral Daily   prenatal multivitamin  1 tablet Oral Q1200   I have reviewed the patient's current medications.  ASSESSMENT: Patient Active Problem List   Diagnosis Date Noted   Fever of unknown origin (FUO) 02/08/2022   Rupture of membranes with clear amniotic fluid 02/15/2017  No ROM   PLAN: Vaginal bleeding now threatened abortion  Scheryl Darter 02/09/2022,7:11  AM

## 2022-02-09 NOTE — Progress Notes (Signed)
° ° °  Faculty Practice OB/GYN Attending Note  Was called to evaluate patient, recently passed nonviable fetus and placenta. Minimal bleeding reported. On evaluation, she had stable vitals signs. Minimal blood on pad noted during pelvic exam, done in presence of her RN. Examination of products showed nonviable 12 week fetus and large edematous placenta in two pieces. Bedside transabdominal scan showed intrauterine contents, possible clots or products of conception. Formal ultrasound ordered, will follow up results and manage accordingly. Methergine 0.2 mg po ordered x 1 for now. She is on antibiotics (Rocephin/Azithromycin/Flagyl) that was started given concern for infection with her low grade temperature (Tm 100.3 1445 02/08/2022) and WBC 23, will continue for now. Appropriate support given to her, she declined SW or Chaplain consultation at this time. Plan was discussed with patient with the help of an [place] interpreter.    Verita Schneiders, MD, [place] for [employer], [place]

## 2022-02-09 NOTE — H&P (Signed)
History    CSN: [id number]   Arrival date and time: 02/08/22 1310    Event Date/Time   First Provider Initiated Contact with Patient 02/08/22 1314          Chief Complaint  Patient presents with   Abdominal Pain   Vaginal Bleeding    HPI Adrienne Morris is a 31 y.o. G3P1011 at [redacted]w[redacted]d by LMP who presents via EMS with abdominal pain & vaginal bleeding. Initially went to urgent care where they called 911 due to amount of pain patient was in.  Abdominal pain started last night. Suprapubic sharp cramping pains that she rates 9/10. Hasn't treated symptoms. Pain is constant. Started bleeding this morning. Can't tell how much but reportedly got heavier while on ambulance en route to [facility]. Reports hx of ectopic pregnancy 9 years ago that was treated with surgery. Unsure which side.    OB History       Gravida  3   Para  1   Term  1   Preterm      AB  1   Living  1        SAB      IAB      Ectopic  1   Multiple  0   Live Births  1                   Past Medical History:  Diagnosis Date   Medical history non-contributory             Past Surgical History:  Procedure Laterality Date   ECTOPIC PREGNANCY SURGERY          No family history on file.   Social History        Tobacco Use   Smoking status: Never   Smokeless tobacco: Never  Substance Use Topics   Alcohol use: No   Drug use: No      Allergies: No Known Allergies          Medications Prior to Admission  Medication Sig Dispense Refill Last Dose   docusate sodium (COLACE) 100 MG capsule Take 100 mg by mouth daily as needed for mild constipation. (Patient not taking: Reported on 02/08/2022)     Not Taking   ibuprofen (ADVIL,MOTRIN) 600 MG tablet Take 1 tablet (600 mg total) by mouth every 6 (six) hours. 30 tablet 0     pantoprazole (PROTONIX) 20 MG tablet TAKE 1 TABLET(20 MG) BY MOUTH DAILY 30 tablet 0     Prenatal Vit-Fe Fumarate-FA (PRENATAL VITAMIN PO) Take 1 tablet by mouth daily.             Review  of Systems  Constitutional: Negative.   Gastrointestinal:  Positive for abdominal pain. Negative for nausea and vomiting.  Genitourinary:  Positive for vaginal bleeding.  Physical Exam    Blood pressure (!) 109/59, pulse 94, temperature 100.2 F (37.9 C), resp. rate 18, last menstrual period 12/15/2021, unknown if currently breastfeeding.   Physical Exam Vitals and nursing note reviewed. Exam conducted with a chaperone present.  Constitutional:      General: She is in acute distress.     Appearance: She is well-developed. She is not toxic-appearing or diaphoretic.  HENT:     Head: Normocephalic and atraumatic.  Pulmonary:     Effort: Pulmonary effort is normal. No respiratory distress.  Abdominal:     General: Abdomen is flat.     Palpations: Abdomen is soft.     Tenderness: There  is abdominal tenderness in the right lower quadrant, suprapubic area and left lower quadrant. There is no guarding or rebound.  Genitourinary:    Uterus: Enlarged.      Comments: Pelvic: NEFG, scant dark red mucoid blood. No active bleeding. Cervix anterior.    Cervix closed Skin:    General: Skin is warm and dry.  Neurological:     General: No focal deficit present.     Mental Status: She is alert.  Psychiatric:        Mood and Affect: Mood normal.        Behavior: Behavior normal.      MAU Course  Procedures Lab Results Last 24 Hours        Results for orders placed or performed during the hospital encounter of 02/08/22 (from the past 24 hour(s))  CBC     Status: Abnormal    Collection Time: 02/08/22  1:14 PM  Result Value Ref Range    WBC 19.7 (H) 4.0 - 10.5 K/uL    RBC 4.09 3.87 - 5.11 MIL/uL    Hemoglobin 11.4 (L) 12.0 - 15.0 g/dL    HCT 34.0 (L) 36.0 - 46.0 %    MCV 83.1 80.0 - 100.0 fL    MCH 27.9 26.0 - 34.0 pg    MCHC 33.5 30.0 - 36.0 g/dL    RDW 14.4 11.5 - 15.5 %    Platelets 243 150 - 400 K/uL    nRBC 0.0 0.0 - 0.2 %  hCG, quantitative, pregnancy     Status: Abnormal     Collection Time: 02/08/22  1:14 PM  Result Value Ref Range    hCG, Beta Chain, Quant, S 85,501 (H) <5 mIU/mL  Comprehensive metabolic panel     Status: Abnormal    Collection Time: 02/08/22  1:14 PM  Result Value Ref Range    Sodium 130 (L) 135 - 145 mmol/L    Potassium 3.2 (L) 3.5 - 5.1 mmol/L    Chloride 101 98 - 111 mmol/L    CO2 20 (L) 22 - 32 mmol/L    Glucose, Bld 88 70 - 99 mg/dL    BUN 8 6 - 20 mg/dL    Creatinine, Ser 0.52 0.44 - 1.00 mg/dL    Calcium 8.7 (L) 8.9 - 10.3 mg/dL    Total Protein 7.3 6.5 - 8.1 g/dL    Albumin 3.5 3.5 - 5.0 g/dL    AST 16 15 - 41 U/L    ALT 11 0 - 44 U/L    Alkaline Phosphatase 49 38 - 126 U/L    Total Bilirubin 0.5 0.3 - 1.2 mg/dL    GFR, Estimated >60 >60 mL/min    Anion gap 9 5 - 15  ABO/Rh     Status: None    Collection Time: 02/08/22  1:33 PM  Result Value Ref Range    ABO/RH(D) A POS      No rh immune globuloin          NOT A RH IMMUNE GLOBULIN CANDIDATE, PT RH POSITIVE Performed at [facility], [address] [place], [place] [address]    Wet prep, genital     Status: None    Collection Time: 02/08/22  1:56 PM    Specimen: PATH Cytology Cervicovaginal Ancillary Only  Result Value Ref Range    Yeast Wet Prep HPF POC NONE SEEN NONE SEEN    Trich, Wet Prep NONE SEEN NONE SEEN    Clue Cells  Wet Prep HPF POC NONE SEEN NONE SEEN    WBC, Wet Prep HPF POC <10 <10    Sperm NONE SEEN    Urinalysis, Routine w reflex microscopic Urine, In & Out Cath     Status: Abnormal    Collection Time: 02/08/22  2:06 PM  Result Value Ref Range    Color, Urine YELLOW YELLOW    APPearance CLEAR CLEAR    Specific Gravity, Urine 1.024 1.005 - 1.030    pH 6.0 5.0 - 8.0    Glucose, UA NEGATIVE NEGATIVE mg/dL    Hgb urine dipstick NEGATIVE NEGATIVE    Bilirubin Urine NEGATIVE NEGATIVE    Ketones, ur 80 (A) NEGATIVE mg/dL    Protein, ur NEGATIVE NEGATIVE mg/dL    Nitrite NEGATIVE NEGATIVE    Leukocytes,Ua NEGATIVE NEGATIVE  Lactic acid, plasma      Status: None    Collection Time: 02/08/22  2:09 PM  Result Value Ref Range    Lactic Acid, Venous 0.9 0.5 - 1.9 mmol/L  Resp Panel by RT-PCR (Flu A&B, Covid) Nasopharyngeal Swab     Status: None    Collection Time: 02/08/22  2:55 PM    Specimen: Nasopharyngeal Swab; Nasopharyngeal(NP) swabs in vial transport medium  Result Value Ref Range    SARS Coronavirus 2 by RT PCR NEGATIVE NEGATIVE    Influenza A by PCR NEGATIVE NEGATIVE    Influenza B by PCR NEGATIVE NEGATIVE      MR PELVIS WO CONTRAST   Result Date: 02/08/2022 CLINICAL DATA:  Right lower quadrant abdominal pain. Twelve weeks pregnant. EXAM: MRI ABDOMEN AND PELVIS WITHOUT CONTRAST TECHNIQUE: Multiplanar multisequence MR imaging of the abdomen and pelvis was performed. No intravenous contrast was administered. COMPARISON:  Today's obstetric ultrasound. FINDINGS: COMBINED FINDINGS FOR BOTH MR ABDOMEN AND PELVIS Lower chest: Normal heart size without pericardial or pleural effusion. Hepatobiliary: Normal noncontrast appearance of the liver, gallbladder, biliary tract. Pancreas:  Normal, without mass or ductal dilatation. Spleen:  Normal in size, without focal abnormality. Adrenals/Urinary Tract: Normal adrenal glands. Normal kidneys, without hydronephrosis. Normal urinary bladder. Stomach/Bowel: Normal stomach and abdominal small bowel loops. Colonic stool burden suggests constipation. The appendix originates on 05/12 and is positioned retrocecal , including on 01/12 and 13/3. No evidence of appendicitis. Vascular/Lymphatic: Normal aortic and branch vessel caliber. No abdominopelvic adenopathy. Reproductive: Intrauterine pregnancy. The left ovary demonstrates a 11 mm probable corpus luteal cyst on 28/3. The right ovary is likely identified just caudal the cecal tip including on 10/17. Other: Trace free pelvic fluid is likely physiologic. No abdominal ascites. Musculoskeletal: No acute osseous abnormality. IMPRESSION: 1. Normal appendix, without  specific explanation for right lower quadrant pain. 2. Probable left ovarian corpus luteal cyst. 3.  Possible constipation. Electronically Signed   By: Abigail Miyamoto M.D.   On: 02/08/2022 17:25    MR ABDOMEN WO CONTRAST   Result Date: 02/08/2022 CLINICAL DATA:  Right lower quadrant abdominal pain. Twelve weeks pregnant. EXAM: MRI ABDOMEN AND PELVIS WITHOUT CONTRAST TECHNIQUE: Multiplanar multisequence MR imaging of the abdomen and pelvis was performed. No intravenous contrast was administered. COMPARISON:  Today's obstetric ultrasound. FINDINGS: COMBINED FINDINGS FOR BOTH MR ABDOMEN AND PELVIS Lower chest: Normal heart size without pericardial or pleural effusion. Hepatobiliary: Normal noncontrast appearance of the liver, gallbladder, biliary tract. Pancreas:  Normal, without mass or ductal dilatation. Spleen:  Normal in size, without focal abnormality. Adrenals/Urinary Tract: Normal adrenal glands. Normal kidneys, without hydronephrosis. Normal urinary bladder. Stomach/Bowel: Normal stomach and abdominal small bowel  loops. Colonic stool burden suggests constipation. The appendix originates on 05/12 and is positioned retrocecal , including on 01/12 and 13/3. No evidence of appendicitis. Vascular/Lymphatic: Normal aortic and branch vessel caliber. No abdominopelvic adenopathy. Reproductive: Intrauterine pregnancy. The left ovary demonstrates a 11 mm probable corpus luteal cyst on 28/3. The right ovary is likely identified just caudal the cecal tip including on 10/17. Other: Trace free pelvic fluid is likely physiologic. No abdominal ascites. Musculoskeletal: No acute osseous abnormality. IMPRESSION: 1. Normal appendix, without specific explanation for right lower quadrant pain. 2. Probable left ovarian corpus luteal cyst. 3.  Possible constipation. Electronically Signed   By: Abigail Miyamoto M.D.   On: 02/08/2022 17:25    US OB LESS THAN 14 WEEKS WITH OB TRANSVAGINAL   Result Date: 02/08/2022 CLINICAL DATA:   Vaginal bleeding, lower abdominal pain. EXAM: OBSTETRIC <14 WK [place] AND TRANSVAGINAL OB US TECHNIQUE: Both transabdominal and transvaginal ultrasound examinations were performed for complete evaluation of the gestation as well as the maternal uterus, adnexal regions, and pelvic cul-de-sac. Transvaginal technique was performed to assess early pregnancy. COMPARISON:  None. FINDINGS: Intrauterine gestational sac: Single Yolk sac:  Not Visualized. Embryo:  Visualized. Cardiac Activity: Visualized. Heart Rate: 193 bpm CRL:  55.1 mm   12 w   1 d                  [place] EDC: August 22, 2022. Subchorionic hemorrhage:  None visualized. Maternal uterus/adnexae: Right ovary is not visualized. Left ovary appears normal. No free fluid is noted. IMPRESSION: Single live intrauterine gestation of 12 weeks 1 day. Electronically Signed   By: Marijo Conception M.D.   On: 02/08/2022 14:31     MDM Patient presents with significant abdominal pain in early pregnancy. Hx of ectopic pregnancy 9 years ago. Initially worked up for ectopic pregnancy based on presentation but ultrasound today shows IUP measuring [redacted]w[redacted]d (EDD updated) with cardiac activity.    Elevated WBC at 19 & temp is 100.3. She denies any symptoms other than lower abdominal pain & vaginal bleeding. Her u/a is negative. Other than some mild electrolyte changes, her CMP & lactic acid are normal.  MRI was ordered, per consult with Dr. Nehemiah Settle, to evaluate for appendicitis & incarcerated uterus. MRI showed some constipation, otherwise normal. Patient reports last BM was 4 days ago which is a normal interval for her since prior to pregnancy.    Was initially given IV dilaudid which helped with pain but pain returned & patient is now very uncomfortable again. Per Dr. Nehemiah Settle, will obs due to leukocytosis & pain. Will plan to repeat CBC in the morning. Blood culture & urine culture ordered.    Patient's primary language is [place] but she declines an interpreter. She verbalizes  understanding of our conversation & answers my questions appropriately.    Assessment and Plan    1. Fever of unknown origin   2. Vaginal bleeding in pregnancy, first trimester   3. Abdominal pain during pregnancy in first trimester   4. Normal IUP (intrauterine pregnancy) on prenatal ultrasound, first trimester   5. [redacted] weeks gestation of pregnancy       -obs on OBSC unit -blood culture & urine culture pending -repeat CBC in the morning   Jorje Guild 02/08/2022, 1:23 PM         Cosigned by: Truett Mainland, DO at 02/08/2022  7:15 PM  Note reviewed and added as H&P  Woodroe Mode, MD

## 2022-02-09 NOTE — Discharge Summary (Signed)
Antenatal Physician Discharge Summary  Patient ID: Adrienne Morris MRN: [id number] DOB/AGE: [birth year] 30 y.o.  Admit date: 02/08/2022 Discharge date: 02/09/2022  Admission Diagnoses: Abdominal pain in first trimester, bleeding in first trimester,  low grade fever, leukocytosis  Discharge Diagnoses: Completed miscarriage  Prenatal Procedures: Ultrasound, MRI  Hospital Course:  Adrienne Morris is a 31 y.o. G3P1011 with IUP at [redacted]w[redacted]d admitted for abdominal pain in first trimester, bleeding in first trimester, low grade fever, leukocytosis. Negatove evaluation for appendicitis, UTI, vaginitis. Given low grade temperatures (Tm 100.3) she was admitted for observation.  She was also treated with broad spectrum antibiotics (Rocephin/Azithromycin and Flagyl). She continued to have bleeding which progressed to complete miscarriage around 1400 on HD#2.  Minimal bleeding afterwards. Given that the placenta came out in fragments, there was a concern for retained products of conception.  This was ruled on on formal ultrasound.  She was grieving appropriately, support given to her.  She was observed for about three hours after miscarriage then deemed stable for discharge to home with outpatient follow up. She had no fevers or other concerning signs/symptoms.   Discharge Exam: Temp:  [98.4 F (36.9 C)-100.2 F (37.9 C)] 99.8 F (37.7 C) (02/14 1509) Pulse Rate:  [84-112] 92 (02/14 1509) Resp:  [16-20] 16 (02/14 1152) BP: (89-109)/(43-59) 91/43 (02/14 1509) SpO2:  [100 %] 100 % (02/14 0622) Physical Examination: CONSTITUTIONAL: Well-developed, well-nourished female in no acute distress.  SKIN: Skin is warm and dry. No rash noted. Not diaphoretic. No erythema. No pallor. NEUROLOGIC: Alert and oriented to person, place, and time. Normal reflexes, muscle tone coordination. No cranial nerve deficit noted. PSYCHIATRIC: Normal mood and affect. Normal behavior. Normal judgment and thought content. CARDIOVASCULAR: Normal heart  rate noted, regular rhythm RESPIRATORY: Effort and breath sounds normal, no problems with respiration noted MUSCULOSKELETAL: Normal range of motion. No edema and no tenderness. 2+ distal pulses. ABDOMEN: Soft, nontender, nondistended, gravid. PELVIS:  Minimal blood on pad (exam done in presence of RN)   Significant Diagnostic Studies:  Results for orders placed or performed during the hospital encounter of 02/08/22 (from the past 168 hour(s))  CBC   Collection Time: 02/08/22  1:14 PM  Result Value Ref Range   WBC 19.7 (H) 4.0 - 10.5 K/uL   RBC 4.09 3.87 - 5.11 MIL/uL   Hemoglobin 11.4 (L) 12.0 - 15.0 g/dL   HCT 24.5 (L) 80.9 - 98.3 %   MCV 83.1 80.0 - 100.0 fL   MCH 27.9 26.0 - 34.0 pg   MCHC 33.5 30.0 - 36.0 g/dL   RDW 38.2 50.5 - 39.7 %   Platelets 243 150 - 400 K/uL   nRBC 0.0 0.0 - 0.2 %  hCG, quantitative, pregnancy   Collection Time: 02/08/22  1:14 PM  Result Value Ref Range   hCG, Beta Chain, Quant, S 85,501 (H) <5 mIU/mL  Comprehensive metabolic panel   Collection Time: 02/08/22  1:14 PM  Result Value Ref Range   Sodium 130 (L) 135 - 145 mmol/L   Potassium 3.2 (L) 3.5 - 5.1 mmol/L   Chloride 101 98 - 111 mmol/L   CO2 20 (L) 22 - 32 mmol/L   Glucose, Bld 88 70 - 99 mg/dL   BUN 8 6 - 20 mg/dL   Creatinine, Ser 6.73 0.44 - 1.00 mg/dL   Calcium 8.7 (L) 8.9 - 10.3 mg/dL   Total Protein 7.3 6.5 - 8.1 g/dL   Albumin 3.5 3.5 - 5.0 g/dL   AST 16 15 - 41  U/L   ALT 11 0 - 44 U/L   Alkaline Phosphatase 49 38 - 126 U/L   Total Bilirubin 0.5 0.3 - 1.2 mg/dL   GFR, Estimated >84 >66 mL/min   Anion gap 9 5 - 15  ABO/Rh   Collection Time: 02/08/22  1:33 PM  Result Value Ref Range   ABO/RH(D) A POS    No rh immune globuloin      NOT A RH IMMUNE GLOBULIN CANDIDATE, PT RH POSITIVE Performed at [facility], [address] [place], [place] [address]   Wet prep, genital   Collection Time: 02/08/22  1:56 PM   Specimen: PATH Cytology Cervicovaginal Ancillary Only   Result Value Ref Range   Yeast Wet Prep HPF POC NONE SEEN NONE SEEN   Trich, Wet Prep NONE SEEN NONE SEEN   Clue Cells Wet Prep HPF POC NONE SEEN NONE SEEN   WBC, Wet Prep HPF POC <10 <10   Sperm NONE SEEN   Urinalysis, Routine w reflex microscopic Urine, In & Out Cath   Collection Time: 02/08/22  2:06 PM  Result Value Ref Range   Color, Urine YELLOW YELLOW   APPearance CLEAR CLEAR   Specific Gravity, Urine 1.024 1.005 - 1.030   pH 6.0 5.0 - 8.0   Glucose, UA NEGATIVE NEGATIVE mg/dL   Hgb urine dipstick NEGATIVE NEGATIVE   Bilirubin Urine NEGATIVE NEGATIVE   Ketones, ur 80 (A) NEGATIVE mg/dL   Protein, ur NEGATIVE NEGATIVE mg/dL   Nitrite NEGATIVE NEGATIVE   Leukocytes,Ua NEGATIVE NEGATIVE  Lactic acid, plasma   Collection Time: 02/08/22  2:09 PM  Result Value Ref Range   Lactic Acid, Venous 0.9 0.5 - 1.9 mmol/L  Resp Panel by RT-PCR (Flu A&B, Covid) Nasopharyngeal Swab   Collection Time: 02/08/22  2:55 PM   Specimen: Nasopharyngeal Swab; Nasopharyngeal(NP) swabs in vial transport medium  Result Value Ref Range   SARS Coronavirus 2 by RT PCR NEGATIVE NEGATIVE   Influenza A by PCR NEGATIVE NEGATIVE   Influenza B by PCR NEGATIVE NEGATIVE  Culture, blood (Routine X 2) w Reflex to ID Panel   Collection Time: 02/08/22  6:28 PM   Specimen: BLOOD  Result Value Ref Range   Specimen Description BLOOD RIGHT ARM    Special Requests      BOTTLES DRAWN AEROBIC AND ANAEROBIC Blood Culture adequate volume   Culture      NO GROWTH < 12 HOURS Performed at [facility], [address] [place], [place] [address]    Report Status PENDING   Culture, blood (Routine X 2) w Reflex to ID Panel   Collection Time: 02/08/22  6:34 PM   Specimen: BLOOD  Result Value Ref Range   Specimen Description BLOOD LEFT ARM    Special Requests      BOTTLES DRAWN AEROBIC AND ANAEROBIC Blood Culture adequate volume   Culture      NO GROWTH < 12 HOURS Performed at [facility], [address] [place], [place] [address]    Report Status PENDING   Type and screen [facility]   Collection Time: 02/08/22  7:40 PM  Result Value Ref Range   ABO/RH(D) A POS    Antibody Screen NEG    Sample Expiration      02/11/2022,2359 Performed at [facility], [address], [place], [place] [address]   CBC   Collection Time: 02/09/22  4:42 AM  Result Value Ref Range  WBC 23.6 (H) 4.0 - 10.5 K/uL   RBC 3.56 (L) 3.87 - 5.11 MIL/uL   Hemoglobin 10.1 (L) 12.0 - 15.0 g/dL   HCT 60.429.3 (L) 54.036.0 - 98.146.0 %   MCV 82.3 80.0 - 100.0 fL   MCH 28.4 26.0 - 34.0 pg   MCHC 34.5 30.0 - 36.0 g/dL   RDW 19.114.6 47.811.5 - 29.515.5 %   Platelets 194 150 - 400 K/uL   nRBC 0.0 0.0 - 0.2 %   MR PELVIS WO CONTRAST  Result Date: 02/08/2022 CLINICAL DATA:  Right lower quadrant abdominal pain. Twelve weeks pregnant. EXAM: MRI ABDOMEN AND PELVIS WITHOUT CONTRAST TECHNIQUE: Multiplanar multisequence MR imaging of the abdomen and pelvis was performed. No intravenous contrast was administered. COMPARISON:  Today's obstetric ultrasound. FINDINGS: COMBINED FINDINGS FOR BOTH MR ABDOMEN AND PELVIS Lower chest: Normal heart size without pericardial or pleural effusion. Hepatobiliary: Normal noncontrast appearance of the liver, gallbladder, biliary tract. Pancreas:  Normal, without mass or ductal dilatation. Spleen:  Normal in size, without focal abnormality. Adrenals/Urinary Tract: Normal adrenal glands. Normal kidneys, without hydronephrosis. Normal urinary bladder. Stomach/Bowel: Normal stomach and abdominal small bowel loops. Colonic stool burden suggests constipation. The appendix originates on 05/12 and is positioned retrocecal , including on 01/12 and 13/3. No evidence of appendicitis. Vascular/Lymphatic: Normal aortic and branch vessel caliber. No abdominopelvic adenopathy. Reproductive: Intrauterine pregnancy. The left ovary demonstrates a 11 mm probable corpus luteal cyst on 28/3. The right ovary is likely  identified just caudal the cecal tip including on 10/17. Other: Trace free pelvic fluid is likely physiologic. No abdominal ascites. Musculoskeletal: No acute osseous abnormality. IMPRESSION: 1. Normal appendix, without specific explanation for right lower quadrant pain. 2. Probable left ovarian corpus luteal cyst. 3.  Possible constipation. Electronically Signed   By: Jeronimo GreavesKyle  Talbot M.D.   On: 02/08/2022 17:25   MR ABDOMEN WO CONTRAST  Result Date: 02/08/2022 CLINICAL DATA:  Right lower quadrant abdominal pain. Twelve weeks pregnant. EXAM: MRI ABDOMEN AND PELVIS WITHOUT CONTRAST TECHNIQUE: Multiplanar multisequence MR imaging of the abdomen and pelvis was performed. No intravenous contrast was administered. COMPARISON:  Today's obstetric ultrasound. FINDINGS: COMBINED FINDINGS FOR BOTH MR ABDOMEN AND PELVIS Lower chest: Normal heart size without pericardial or pleural effusion. Hepatobiliary: Normal noncontrast appearance of the liver, gallbladder, biliary tract. Pancreas:  Normal, without mass or ductal dilatation. Spleen:  Normal in size, without focal abnormality. Adrenals/Urinary Tract: Normal adrenal glands. Normal kidneys, without hydronephrosis. Normal urinary bladder. Stomach/Bowel: Normal stomach and abdominal small bowel loops. Colonic stool burden suggests constipation. The appendix originates on 05/12 and is positioned retrocecal , including on 01/12 and 13/3. No evidence of appendicitis. Vascular/Lymphatic: Normal aortic and branch vessel caliber. No abdominopelvic adenopathy. Reproductive: Intrauterine pregnancy. The left ovary demonstrates a 11 mm probable corpus luteal cyst on 28/3. The right ovary is likely identified just caudal the cecal tip including on 10/17. Other: Trace free pelvic fluid is likely physiologic. No abdominal ascites. Musculoskeletal: No acute osseous abnormality. IMPRESSION: 1. Normal appendix, without specific explanation for right lower quadrant pain. 2. Probable left  ovarian corpus luteal cyst. 3.  Possible constipation. Electronically Signed   By: Jeronimo GreavesKyle  Talbot M.D.   On: 02/08/2022 17:25   US PELVIS (TRANSABDOMINAL ONLY)  Result Date: 02/09/2022 CLINICAL DATA:  Spontaneous abortion EXAM: TRANSABDOMINAL ULTRASOUND OF PELVIS TECHNIQUE: Transabdominal ultrasound examination of the pelvis was performed including evaluation of the uterus, ovaries, adnexal regions, and pelvic cul-de-sac. COMPARISON:  Pelvic ultrasound 02/08/2022 FINDINGS: Uterus Measurements: 10.8 x 12.7 x  7.4 cm = volume: 527 mL. No fibroids or other mass visualized. Endometrium Thickness: Previous intrauterine gestational sac and fetal pole are no longer visualized consistent with history of spontaneous abortion. The endometrium is thickened and inhomogeneous including an asymmetrically thickened area anteriorly which demonstrates mildly increased color Doppler flow. Right ovary Measurements: Mild visualized Left ovary Measurements: 3.9 x 2.3 x 2.7 cm = volume: 12.5 mL. Normal appearance/no adnexal mass. Other findings:  No abnormal free fluid. IMPRESSION: Interval spontaneous abortion. Thickened inhomogeneous endometrium including asymmetrically thickened area anteriorly which demonstrates mildly increased color Doppler flow, possibly retained products of conception. Electronically Signed   By: Jannifer Hick M.D.   On: 02/09/2022 15:40   US OB LESS THAN 14 WEEKS WITH OB TRANSVAGINAL  Result Date: 02/08/2022 CLINICAL DATA:  Vaginal bleeding, lower abdominal pain. EXAM: OBSTETRIC <14 WK [place] AND TRANSVAGINAL OB US TECHNIQUE: Both transabdominal and transvaginal ultrasound examinations were performed for complete evaluation of the gestation as well as the maternal uterus, adnexal regions, and pelvic cul-de-sac. Transvaginal technique was performed to assess early pregnancy. COMPARISON:  None. FINDINGS: Intrauterine gestational sac: Single Yolk sac:  Not Visualized. Embryo:  Visualized. Cardiac Activity:  Visualized. Heart Rate: 193 bpm CRL:  55.1 mm   12 w   1 d                  [place] EDC: August 22, 2022. Subchorionic hemorrhage:  None visualized. Maternal uterus/adnexae: Right ovary is not visualized. Left ovary appears normal. No free fluid is noted. IMPRESSION: Single live intrauterine gestation of 12 weeks 1 day. Electronically Signed   By: Lupita Raider M.D.   On: 02/08/2022 14:31    No future appointments.  Discharge Condition: Stable  Discharge disposition: 01-Home or Self Care        Allergies as of 02/09/2022   No Known Allergies      Medication List     STOP taking these medications    PRENATAL VITAMIN PO       TAKE these medications    amoxicillin-clavulanate 875-125 MG tablet Commonly known as: Augmentin Take 1 tablet by mouth 2 (two) times daily for 7 days.   docusate sodium 100 MG capsule Commonly known as: COLACE Take 1 capsule (100 mg total) by mouth 2 (two) times daily as needed for mild constipation or moderate constipation. What changed:  when to take this reasons to take this   ibuprofen 800 MG tablet Commonly known as: ADVIL Take 1 tablet (800 mg total) by mouth every 8 (eight) hours as needed for moderate pain. What changed:  medication strength how much to take when to take this reasons to take this   oxyCODONE 5 MG immediate release tablet Commonly known as: Oxy IR/ROXICODONE Take 1 tablet (5 mg total) by mouth every 4 (four) hours as needed for severe pain or breakthrough pain.   pantoprazole 20 MG tablet Commonly known as: PROTONIX TAKE 1 TABLET(20 MG) BY MOUTH DAILY        Follow-up Information     [facility] Follow up in 3 week(s).   Specialty: Obstetrics and Gynecology Why: You will be called with appointment date and time for follow up appointment Contact information: [address], [address] [place] [address] [phone]                Total discharge time:  20 minutes   Signed: Jaynie Collins M.D. 02/09/2022, 4:36 PM

## 2022-02-10 ENCOUNTER — Encounter: Payer: Self-pay | Admitting: Obstetrics & Gynecology

## 2022-02-10 ENCOUNTER — Telehealth: Payer: Self-pay

## 2022-02-10 ENCOUNTER — Telehealth: Payer: Self-pay | Admitting: Obstetrics

## 2022-02-10 LAB — CULTURE, OB URINE
Culture: NO GROWTH
Special Requests: NORMAL

## 2022-02-10 LAB — GC/CHLAMYDIA PROBE AMP (~~LOC~~) NOT AT ARMC
Chlamydia: POSITIVE — AB
Comment: NEGATIVE
Comment: NORMAL
Neisseria Gonorrhea: NEGATIVE

## 2022-02-10 NOTE — Telephone Encounter (Signed)
Attempted to contact about results and treatment plan. No answer, unable to leave vm

## 2022-02-13 LAB — CULTURE, BLOOD (ROUTINE X 2)
Culture: NO GROWTH
Culture: NO GROWTH
Special Requests: ADEQUATE
Special Requests: ADEQUATE

## 2022-03-05 ENCOUNTER — Ambulatory Visit: Payer: Medicaid Other | Admitting: Obstetrics & Gynecology

## 2023-11-25 IMAGING — US US PELVIS COMPLETE
1 series · 15 of 22 positions shown · non-contrast
Comparison: Pelvic ultrasound 02/08/2022

CLINICAL DATA: Spontaneous abortion

EXAM:
TRANSABDOMINAL ULTRASOUND OF PELVIS
TECHNIQUE: Transabdominal ultrasound examination of the pelvis was performed
including evaluation of the uterus, ovaries, adnexal regions, and
pelvic cul-de-sac.

[Series 1: us pelvis complete · 15 of 22 slices shown]
[im 1/22]
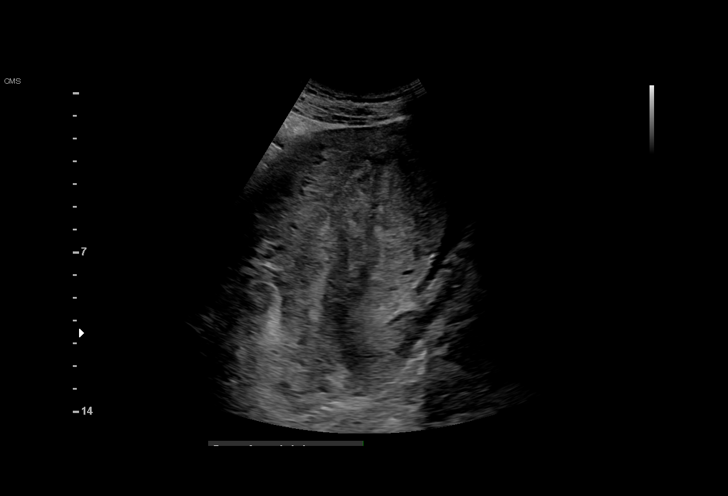
[im 3/22]
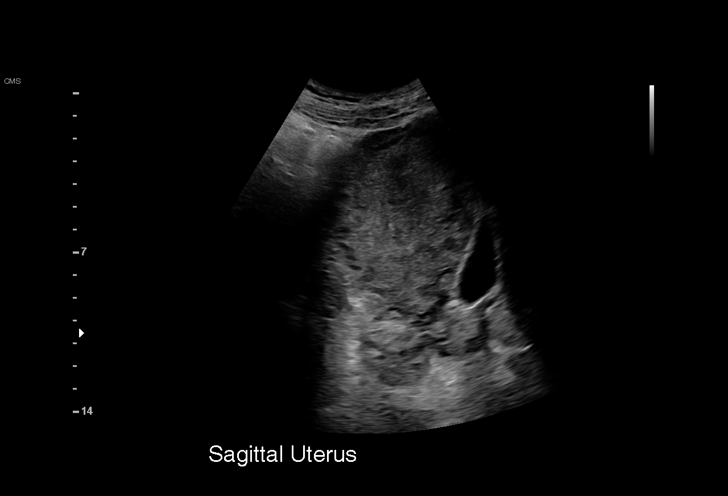
[im 4/22]
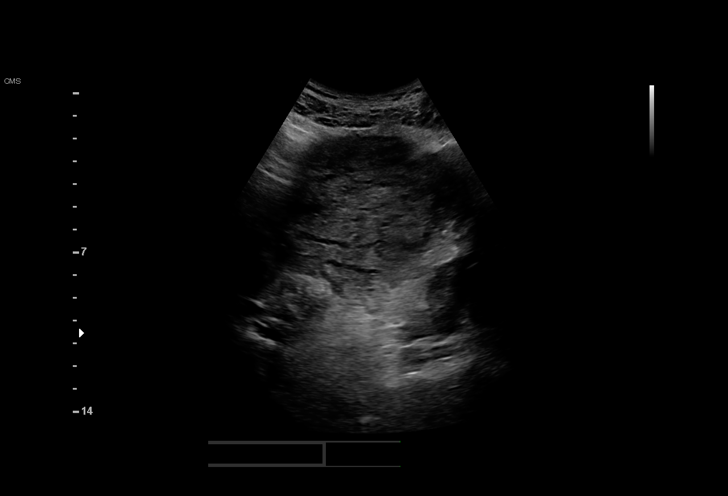
[im 6/22]
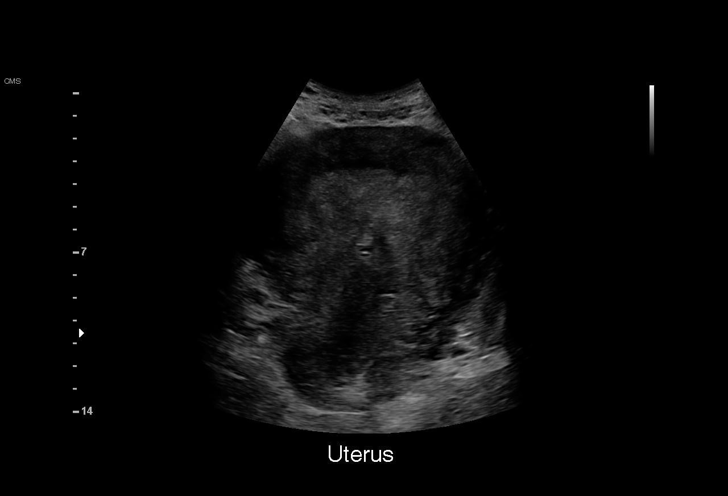
[im 7/22]
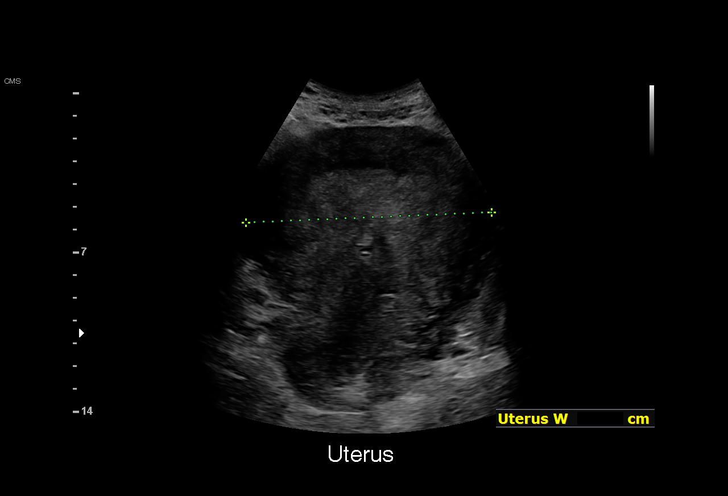
[im 9/22]
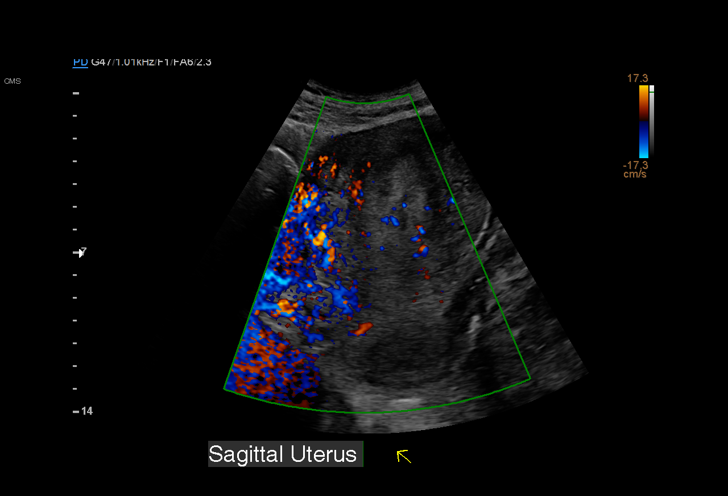
[im 10/22]
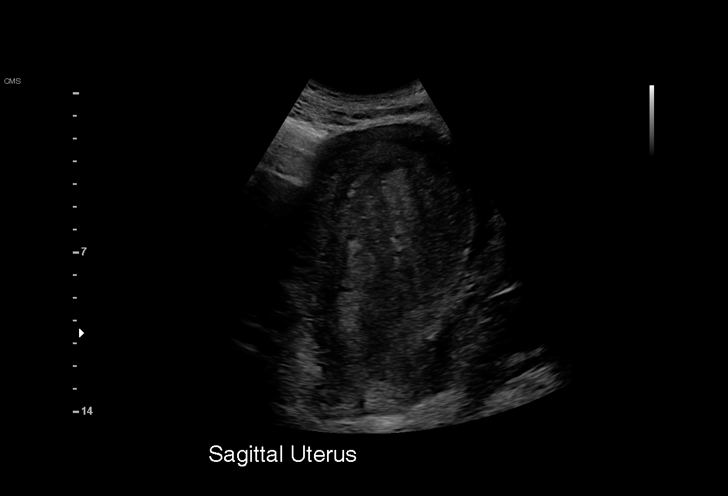
[im 12/22]
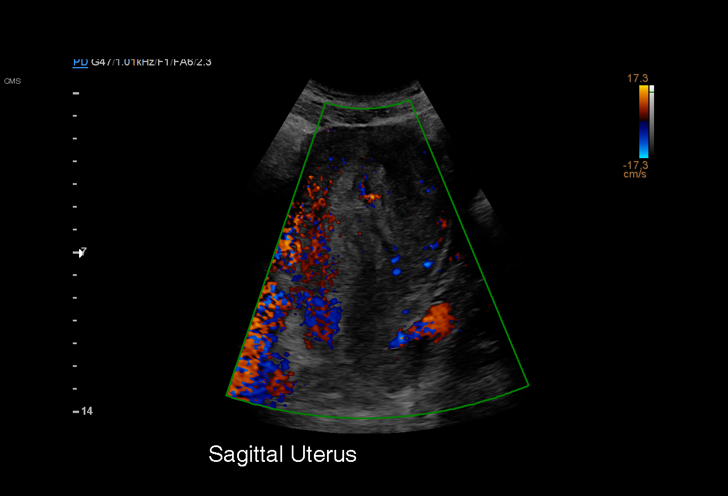
[im 13/22]
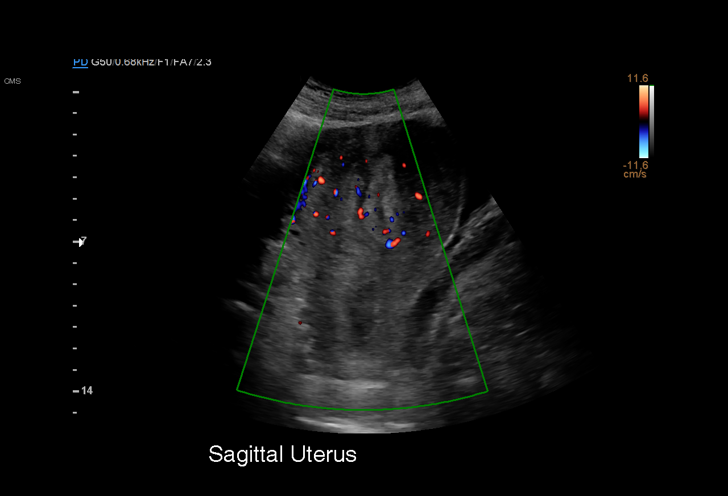
[im 14/22]
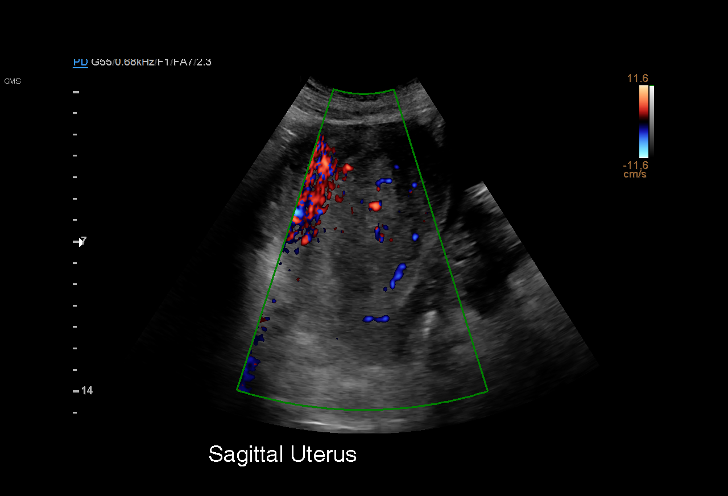
[im 16/22]
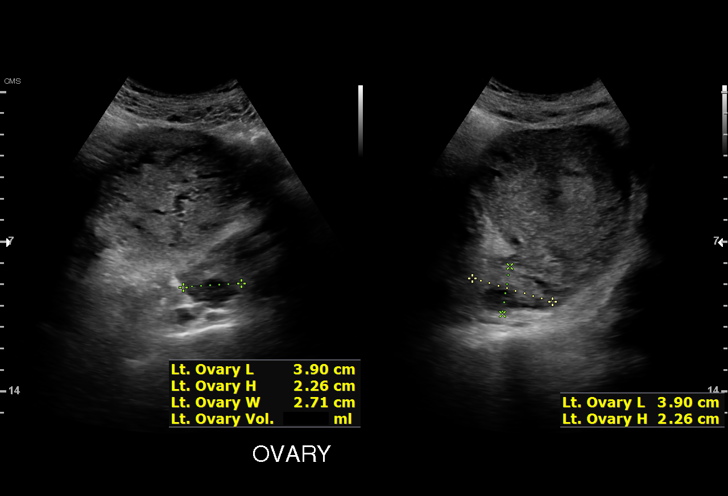
[im 17/22]
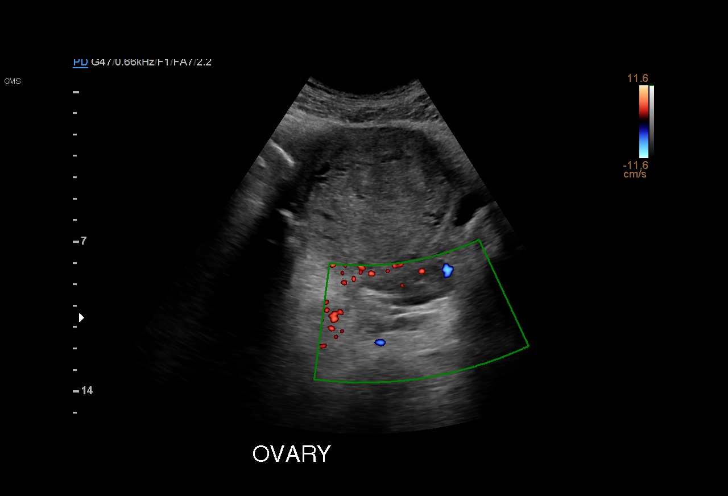
[im 19/22]
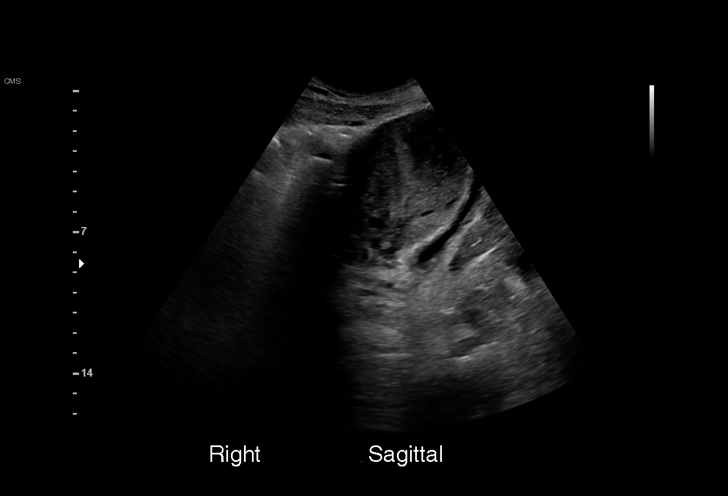
[im 20/22]
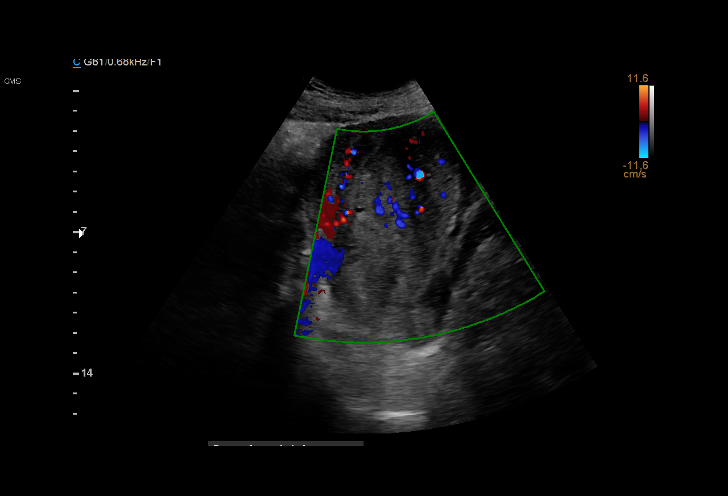
[im 22/22]
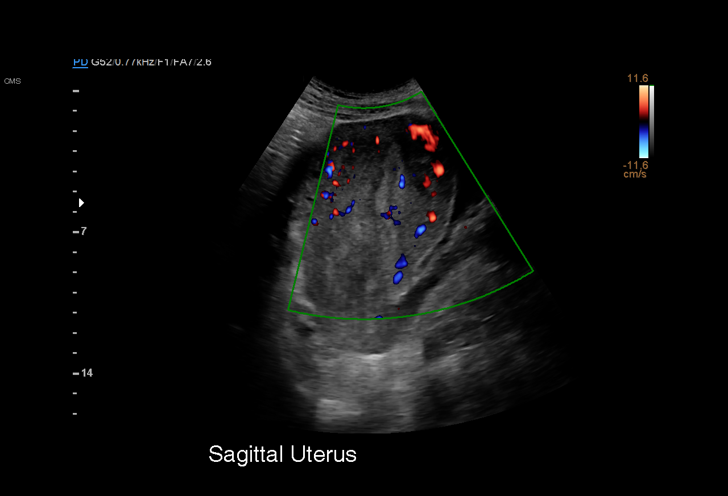

[15 of 22 positions shown; findings below may reference images not displayed]

FINDINGS: Uterus

Measurements: 10.8 x 12.7 x 7.4 cm = volume: 527 mL. No fibroids or
other mass visualized.

Endometrium

Thickness: Previous intrauterine gestational sac and fetal pole are
no longer visualized consistent with history of spontaneous
abortion. The endometrium is thickened and inhomogeneous including
an asymmetrically thickened area anteriorly which demonstrates
mildly increased color Doppler flow.

Right ovary

Measurements: Mild visualized

Left ovary

Measurements: 3.9 x 2.3 x 2.7 cm = volume: 12.5 mL. Normal
appearance/no adnexal mass.

Other findings:  No abnormal free fluid.
IMPRESSION: Interval spontaneous abortion. Thickened inhomogeneous endometrium
including asymmetrically thickened area anteriorly which
demonstrates mildly increased color Doppler flow, possibly retained
products of conception.

## 2023-12-28 NOTE — L&D Delivery Note (Signed)
 OB/GYN Faculty Practice Delivery Note  Adrienne Morris is a 33 y.o. [id number] s/p SVD at [redacted]w[redacted]d. She was admitted for active labor.   ROM: 1h 19m with clear fluid GBS Status: Positive/-- (07/15 1507), treated with Ampicillin  Maximum Maternal Temperature: 98.66F   Labor Progress: Initial SVE: 6.5/90/-1. She then progressed to complete.   Delivery Date/Time: 07/17/24 1918 Delivery: Called to room and patient was complete and pushing. Head delivered ROA. No nuchal cord present. Shoulder and body delivered in usual fashion. Infant with spontaneous cry, placed on mother's abdomen, dried and stimulated. Cord clamped x 2 after 1-minute delay, and cut by myself. Cord blood drawn. Placenta delivered spontaneously with gentle cord traction. Fundus firm with massage and Pitocin . Labia, perineum, vagina, and cervix inspected inspected with a small right periurethral laceration noted, which was repaired with good hemostasis.  Baby Weight: pending  Placenta: Sent to pathology Complications: None Lacerations: Right periurethral laceration -- repaired EBL: 250 mL Analgesia: Epidural   Infant:  APGAR (1 MIN): 9  APGAR (5 MINS): 9   Alain Sor, MD OB Fellow, Faculty Practice [facility], [facility]

## 2024-05-17 ENCOUNTER — Ambulatory Visit

## 2024-07-10 ENCOUNTER — Other Ambulatory Visit: Payer: Self-pay

## 2024-07-10 ENCOUNTER — Other Ambulatory Visit (HOSPITAL_COMMUNITY)
Admission: RE | Admit: 2024-07-10 | Discharge: 2024-07-10 | Disposition: A | Discharge status: Home / Self Care | Source: Ambulatory Visit | Attending: Family Medicine | Admitting: Family Medicine

## 2024-07-10 ENCOUNTER — Ambulatory Visit (INDEPENDENT_AMBULATORY_CARE_PROVIDER_SITE_OTHER): Admitting: Family Medicine

## 2024-07-10 VITALS — BP 115/50 | HR 70 | Wt 155.5 lb

## 2024-07-10 DIAGNOSIS — O0990 Supervision of high risk pregnancy, unspecified, unspecified trimester: Secondary | ICD-10-CM | POA: Insufficient documentation

## 2024-07-10 DIAGNOSIS — Z3493 Encounter for supervision of normal pregnancy, unspecified, third trimester: Secondary | ICD-10-CM

## 2024-07-10 DIAGNOSIS — Z3490 Encounter for supervision of normal pregnancy, unspecified, unspecified trimester: Secondary | ICD-10-CM | POA: Diagnosis present

## 2024-07-10 DIAGNOSIS — O0933 Supervision of pregnancy with insufficient antenatal care, third trimester: Secondary | ICD-10-CM

## 2024-07-10 DIAGNOSIS — Z3A37 37 weeks gestation of pregnancy: Secondary | ICD-10-CM | POA: Diagnosis not present

## 2024-07-10 MED ORDER — PRENATAL VITAMINS 27-0.8 MG PO TABS: 1.0000 | ORAL_TABLET | Freq: Every day | ORAL | 2 refills | Status: AC

## 2024-07-10 NOTE — Patient Instructions (Addendum)
 Safe Medications in Pregnancy   Acne:  Benzoyl Peroxide  Salicylic Acid   Backache/Headache:  Tylenol : 2 regular strength every 4 hours OR               2 Extra strength every 6 hours   Colds/Coughs/Allergies:  Benadryl  (alcohol free) 25 mg every 6 hours as needed  Breath right strips  Claritin  Cepacol throat lozenges  Chloraseptic throat spray  Cold-Eeze- up to three times per day  Cough drops, alcohol free  Flonase (by prescription only)  Guaifenesin  Mucinex  Robitussin DM (plain only, alcohol free)  Saline nasal spray/drops  Sudafed (pseudoephedrine) & Actifed * use only after [redacted] weeks gestation and if you do not have high blood pressure  Tylenol   Vicks Vaporub  Zinc lozenges  Zyrtec   Constipation:  Colace  Ducolax suppositories  Fleet enema  Glycerin  suppositories  Metamucil  Milk of magnesia  Miralax  Senokot  Smooth move tea   Diarrhea:  Kaopectate  Imodium A-D   *NO pepto Bismol   Hemorrhoids:  Anusol  Anusol HC  Preparation H  Tucks   Indigestion:  Tums  Maalox  Mylanta  Zantac  Pepcid   Insomnia:  Benadryl  (alcohol free) 25mg  every 6 hours as needed  Tylenol  PM  Unisom, no Gelcaps   Leg Cramps:  Tums  MagGel   Nausea/Vomiting:  Bonine  Dramamine  Emetrol  Ginger extract  Sea bands  Meclizine  Nausea medication to take during pregnancy:  Unisom (doxylamine succinate 25 mg tablets) Take one tablet daily at bedtime. If symptoms are not adequately controlled, the dose can be increased to a maximum recommended dose of two tablets daily (1/2 tablet in the morning, 1/2 tablet mid-afternoon and one at bedtime).  Vitamin B6 100mg  tablets. Take one tablet twice a day (up to 200 mg per day).   Skin Rashes:  Aveeno products  Benadryl  cream or 25mg  every 6 hours as needed  Calamine Lotion  1% cortisone cream   Yeast infection:  Gyne-lotrimin 7  Monistat 7    **If taking multiple medications, please check labels to avoid  duplicating the same active ingredients  **take medication as directed on the label  ** Do not exceed 4000 mg of tylenol  in 24 hours  **Do not take medications that contain aspirin or ibuprofen           Maternity Assessment Unit for Emergencies or if you go into labor  [address] [place] [address] Entrance C  Ultrasound scheduling [phone]

## 2024-07-10 NOTE — Addendum Note (Signed)
 Addended by: MICHAE LOA CROME on: 07/10/2024 03:17 PM   Modules accepted: Orders

## 2024-07-10 NOTE — Progress Notes (Signed)
 Possible Pregnancy  Here today for pregnancy confirmation. UPT in office today is positive. Pt reports first positive home UPT on 12/28/2023. Reviewed dating with patient:   LMP: 10/21/2023 EDD: 07/27/2024 37w 4d today  OB history reviewed. Reviewed medications and allergies with patient; list of medications safe to take during pregnancy given.  Recommended pt begin prenatal vitamin and schedule prenatal care.  Cyndee JAYSON Molt, RN 07/10/2024  11:57 AM

## 2024-07-10 NOTE — Progress Notes (Signed)
  History:  [name] is a 33 y.o. [phone] who presents to clinic today for pregnancy confirmation.   Patient states she is about [redacted] weeks pregnant Considering adoption, did not go into details about why and is accompanied by adoption agency staff Delivered by our group in 2018 Reports no new surgeries or medical conditions since that time  Past Medical History:  Diagnosis Date   Chlamydia infection 02/08/2022   Resulted in SAB at 12 weeks   Medical history non-contributory     Past Surgical History:  Procedure Laterality Date   CESAREAN SECTION     ECTOPIC PREGNANCY SURGERY      The following portions of the patient's history were reviewed and updated as appropriate: allergies, current medications, past family history, past medical history, past social history, past surgical history and problem list.   Review of Systems:  Pertinent items noted in HPI and remainder of comprehensive ROS otherwise negative.  Objective:  Physical Exam BP (!) 115/50   Pulse 70   Wt 155 lb 8 oz (70.5 kg)   LMP 10/21/2023 (Exact Date)   BMI 25.10 kg/m  Physical Exam Vitals reviewed.  Constitutional:      General: She is not in acute distress.    Appearance: She is well-developed. She is not diaphoretic.  Eyes:     General: No scleral icterus. Pulmonary:     Effort: Pulmonary effort is normal. No respiratory distress.  Skin:    General: Skin is warm and dry.  Neurological:     Mental Status: She is alert.     Coordination: Coordination normal.      Labs and Imaging No results found for this or any previous visit (from the past 24 hours).  No results found.   Assessment & Plan:  1. Encounter for supervision of low-risk pregnancy, antepartum (Primary) Will do NST given late presentation to care Scheduled for new OB visit and labs, will attempt to get anatomy scan as soon as possible - US  MFM OB DETAIL +14 WK; Future - CBC/D/Plt+RPR+Rh+ABO+RubIgG... - HgB A1c - Culture, OB  Urine - GC/Chlamydia probe amp ([place])not at [facility] - Culture, beta strep (group b only)    Approximately 15 minutes of total time was spent with this patient on history taking, coordination of care, education and documentation.   Adrienne Donnice HERO, MD 07/10/2024 2:27 PM

## 2024-07-11 LAB — CBC/D/PLT+RPR+RH+ABO+RUBIGG...
Antibody Screen: NEGATIVE
Basophils Absolute: 0.1 x10E3/uL (ref 0.0–0.2)
Basos: 1 %
EOS (ABSOLUTE): 0.2 x10E3/uL (ref 0.0–0.4)
Eos: 3 %
HCV Ab: NONREACTIVE
HIV Screen 4th Generation wRfx: NONREACTIVE
Hematocrit: 30.1 % — ABNORMAL LOW (ref 34.0–46.6)
Hemoglobin: 9.1 g/dL — ABNORMAL LOW (ref 11.1–15.9)
Hepatitis B Surface Ag: NEGATIVE
Immature Grans (Abs): 0.2 x10E3/uL — ABNORMAL HIGH (ref 0.0–0.1)
Immature Granulocytes: 3 %
Lymphocytes Absolute: 1.4 x10E3/uL (ref 0.7–3.1)
Lymphs: 17 %
MCH: 22.9 pg — ABNORMAL LOW (ref 26.6–33.0)
MCHC: 30.2 g/dL — ABNORMAL LOW (ref 31.5–35.7)
MCV: 76 fL — ABNORMAL LOW (ref 79–97)
Monocytes Absolute: 0.7 x10E3/uL (ref 0.1–0.9)
Monocytes: 8 %
Neutrophils Absolute: 5.8 x10E3/uL (ref 1.4–7.0)
Neutrophils: 68 %
Platelets: 284 x10E3/uL (ref 150–450)
RBC: 3.97 x10E6/uL (ref 3.77–5.28)
RDW: 14.7 % (ref 11.7–15.4)
RPR Ser Ql: NONREACTIVE
Rh Factor: POSITIVE
Rubella Antibodies, IGG: 23.8 {index} (ref >0.99)
WBC: 8.4 x10E3/uL (ref 3.4–10.8)

## 2024-07-11 LAB — GC/CHLAMYDIA PROBE AMP (~~LOC~~) NOT AT ARMC
Chlamydia: POSITIVE — AB
Comment: NEGATIVE
Comment: NORMAL
Neisseria Gonorrhea: NEGATIVE

## 2024-07-11 LAB — HCV INTERPRETATION

## 2024-07-11 LAB — HEMOGLOBIN A1C
Est. average glucose Bld gHb Est-mCnc: 111 mg/dL
Hgb A1c MFr Bld: 5.5 % (ref 4.8–5.6)

## 2024-07-13 LAB — CULTURE, BETA STREP (GROUP B ONLY): Strep Gp B Culture: POSITIVE — AB

## 2024-07-13 LAB — CULTURE, OB URINE

## 2024-07-13 LAB — URINE CULTURE, OB REFLEX

## 2024-07-15 ENCOUNTER — Encounter: Payer: Self-pay | Admitting: Advanced Practice Midwife

## 2024-07-15 ENCOUNTER — Ambulatory Visit: Payer: Self-pay | Admitting: Advanced Practice Midwife

## 2024-07-15 DIAGNOSIS — A749 Chlamydial infection, unspecified: Secondary | ICD-10-CM

## 2024-07-15 DIAGNOSIS — D509 Iron deficiency anemia, unspecified: Secondary | ICD-10-CM | POA: Insufficient documentation

## 2024-07-15 DIAGNOSIS — O99820 Streptococcus B carrier state complicating pregnancy: Secondary | ICD-10-CM | POA: Insufficient documentation

## 2024-07-15 MED ORDER — AZITHROMYCIN 250 MG PO TABS: 1000.0000 mg | ORAL_TABLET | Freq: Once | ORAL | 0 refills | Status: AC

## 2024-07-15 MED ORDER — FERROUS SULFATE 325 (65 FE) MG PO TBEC: 325.0000 mg | DELAYED_RELEASE_TABLET | ORAL | 2 refills | Status: AC

## 2024-07-17 ENCOUNTER — Inpatient Hospital Stay (HOSPITAL_COMMUNITY)
Admission: AD | Admit: 2024-07-17 | Discharge: 2024-07-18 | DRG: 806 | Disposition: A | Discharge status: Home / Self Care | Attending: Obstetrics and Gynecology | Admitting: Obstetrics and Gynecology

## 2024-07-17 ENCOUNTER — Inpatient Hospital Stay (HOSPITAL_COMMUNITY): Admitting: Anesthesiology

## 2024-07-17 ENCOUNTER — Encounter (HOSPITAL_COMMUNITY): Payer: Self-pay | Admitting: Obstetrics & Gynecology

## 2024-07-17 ENCOUNTER — Inpatient Hospital Stay (HOSPITAL_BASED_OUTPATIENT_CLINIC_OR_DEPARTMENT_OTHER)

## 2024-07-17 ENCOUNTER — Other Ambulatory Visit: Payer: Self-pay

## 2024-07-17 DIAGNOSIS — A568 Sexually transmitted chlamydial infection of other sites: Secondary | ICD-10-CM | POA: Diagnosis present

## 2024-07-17 DIAGNOSIS — Z369 Encounter for antenatal screening, unspecified: Secondary | ICD-10-CM

## 2024-07-17 DIAGNOSIS — O99824 Streptococcus B carrier state complicating childbirth: Principal | ICD-10-CM | POA: Diagnosis present

## 2024-07-17 DIAGNOSIS — O26893 Other specified pregnancy related conditions, third trimester: Secondary | ICD-10-CM | POA: Diagnosis present

## 2024-07-17 DIAGNOSIS — Z3A28 28 weeks gestation of pregnancy: Secondary | ICD-10-CM

## 2024-07-17 DIAGNOSIS — Z758 Other problems related to medical facilities and other health care: Secondary | ICD-10-CM | POA: Diagnosis present

## 2024-07-17 DIAGNOSIS — O9832 Other infections with a predominantly sexual mode of transmission complicating childbirth: Secondary | ICD-10-CM | POA: Diagnosis present

## 2024-07-17 DIAGNOSIS — O0933 Supervision of pregnancy with insufficient antenatal care, third trimester: Secondary | ICD-10-CM

## 2024-07-17 DIAGNOSIS — O0930 Supervision of pregnancy with insufficient antenatal care, unspecified trimester: Secondary | ICD-10-CM

## 2024-07-17 DIAGNOSIS — A749 Chlamydial infection, unspecified: Secondary | ICD-10-CM | POA: Diagnosis present

## 2024-07-17 DIAGNOSIS — D509 Iron deficiency anemia, unspecified: Secondary | ICD-10-CM | POA: Diagnosis present

## 2024-07-17 DIAGNOSIS — O99820 Streptococcus B carrier state complicating pregnancy: Principal | ICD-10-CM

## 2024-07-17 DIAGNOSIS — Z3A38 38 weeks gestation of pregnancy: Secondary | ICD-10-CM

## 2024-07-17 DIAGNOSIS — Z3490 Encounter for supervision of normal pregnancy, unspecified, unspecified trimester: Secondary | ICD-10-CM

## 2024-07-17 DIAGNOSIS — O9902 Anemia complicating childbirth: Secondary | ICD-10-CM | POA: Diagnosis present

## 2024-07-17 DIAGNOSIS — Z603 Acculturation difficulty: Secondary | ICD-10-CM | POA: Diagnosis present

## 2024-07-17 LAB — CBC
HCT: 30.2 % — ABNORMAL LOW (ref 36.0–46.0)
Hemoglobin: 9.6 g/dL — ABNORMAL LOW (ref 12.0–15.0)
MCH: 23.1 pg — ABNORMAL LOW (ref 26.0–34.0)
MCHC: 31.8 g/dL (ref 30.0–36.0)
MCV: 72.6 fL — ABNORMAL LOW (ref 80.0–100.0)
Platelets: 283 K/uL (ref 150–400)
RBC: 4.16 MIL/uL (ref 3.87–5.11)
RDW: 15.9 % — ABNORMAL HIGH (ref 11.5–15.5)
WBC: 10.1 K/uL (ref 4.0–10.5)
nRBC: 0.3 % — ABNORMAL HIGH (ref 0.0–0.2)

## 2024-07-17 MED ORDER — IBUPROFEN 600 MG PO TABS
600.0000 mg | ORAL_TABLET | Freq: Four times a day (QID) | ORAL | Status: DC
  Administered 2024-07-17 – 2024-07-18 (×3): 600 mg via ORAL
  Filled 2024-07-17 (×3): qty 1

## 2024-07-17 MED ORDER — SODIUM CHLORIDE 0.9 % IV SOLN
1.0000 g | INTRAVENOUS | Status: DC
  Administered 2024-07-17: 1 g via INTRAVENOUS
  Filled 2024-07-17 (×4): qty 1000

## 2024-07-17 MED ORDER — FENTANYL-BUPIVACAINE-NACL 0.5-0.125-0.9 MG/250ML-% EP SOLN
12.0000 mL/h | EPIDURAL | Status: DC | PRN
  Administered 2024-07-17: 12 mL/h via EPIDURAL
  Filled 2024-07-17: qty 250

## 2024-07-17 MED ORDER — OXYTOCIN BOLUS FROM INFUSION
333.0000 mL | Freq: Once | INTRAVENOUS | Status: AC
  Administered 2024-07-17: 333 mL via INTRAVENOUS

## 2024-07-17 MED ORDER — EPHEDRINE 5 MG/ML INJ: 10.0000 mg | INTRAVENOUS | Status: DC | PRN

## 2024-07-17 MED ORDER — LIDOCAINE HCL (PF) 1 % IJ SOLN: 30.0000 mL | INTRAMUSCULAR | Status: DC | PRN

## 2024-07-17 MED ORDER — FERROUS SULFATE 325 (65 FE) MG PO TABS
325.0000 mg | ORAL_TABLET | ORAL | Status: DC
  Administered 2024-07-18: 325 mg via ORAL
  Filled 2024-07-17 (×2): qty 1

## 2024-07-17 MED ORDER — PRENATAL MULTIVITAMIN CH
1.0000 | ORAL_TABLET | Freq: Every day | ORAL | Status: DC
  Administered 2024-07-18: 1 via ORAL
  Filled 2024-07-17: qty 1

## 2024-07-17 MED ORDER — BUPIVACAINE HCL (PF) 0.25 % IJ SOLN
INTRAMUSCULAR | Status: DC | PRN
  Administered 2024-07-17: 2 mg via INTRATHECAL

## 2024-07-17 MED ORDER — TERBUTALINE SULFATE 1 MG/ML IJ SOLN: 0.2500 mg | Freq: Once | INTRAMUSCULAR | Status: DC

## 2024-07-17 MED ORDER — SODIUM CHLORIDE 0.9 % IV SOLN
2.0000 g | Freq: Once | INTRAVENOUS | Status: AC
  Administered 2024-07-17: 2 g via INTRAVENOUS
  Filled 2024-07-17: qty 2000

## 2024-07-17 MED ORDER — OXYCODONE HCL 5 MG PO TABS: 5.0000 mg | ORAL_TABLET | Freq: Three times a day (TID) | ORAL | Status: DC | PRN

## 2024-07-17 MED ORDER — FENTANYL CITRATE (PF) 100 MCG/2ML IJ SOLN: 50.0000 ug | INTRAMUSCULAR | Status: DC | PRN

## 2024-07-17 MED ORDER — SENNOSIDES-DOCUSATE SODIUM 8.6-50 MG PO TABS: 2.0000 | ORAL_TABLET | Freq: Every evening | ORAL | Status: DC | PRN

## 2024-07-17 MED ORDER — OXYCODONE-ACETAMINOPHEN 5-325 MG PO TABS: 1.0000 | ORAL_TABLET | ORAL | Status: DC | PRN

## 2024-07-17 MED ORDER — OXYCODONE-ACETAMINOPHEN 5-325 MG PO TABS: 2.0000 | ORAL_TABLET | ORAL | Status: DC | PRN

## 2024-07-17 MED ORDER — ONDANSETRON HCL 4 MG/2ML IJ SOLN: 4.0000 mg | Freq: Four times a day (QID) | INTRAMUSCULAR | Status: DC | PRN

## 2024-07-17 MED ORDER — ACETAMINOPHEN 325 MG PO TABS
650.0000 mg | ORAL_TABLET | ORAL | Status: DC | PRN
Start: 2024-07-17 — End: 2024-07-17

## 2024-07-17 MED ORDER — WITCH HAZEL-GLYCERIN EX PADS: 1.0000 | MEDICATED_PAD | CUTANEOUS | Status: DC | PRN

## 2024-07-17 MED ORDER — SIMETHICONE 80 MG PO CHEW: 80.0000 mg | CHEWABLE_TABLET | ORAL | Status: DC | PRN

## 2024-07-17 MED ORDER — LACTATED RINGERS IV SOLN: INTRAVENOUS | Status: DC

## 2024-07-17 MED ORDER — DIPHENHYDRAMINE HCL 25 MG PO CAPS: 25.0000 mg | ORAL_CAPSULE | Freq: Four times a day (QID) | ORAL | Status: DC | PRN

## 2024-07-17 MED ORDER — BENZOCAINE-MENTHOL 20-0.5 % EX AERO: 1.0000 | INHALATION_SPRAY | CUTANEOUS | Status: DC | PRN

## 2024-07-17 MED ORDER — LACTATED RINGERS IV SOLN: 500.0000 mL | INTRAVENOUS | Status: DC | PRN

## 2024-07-17 MED ORDER — PHENYLEPHRINE 80 MCG/ML (10ML) SYRINGE FOR IV PUSH (FOR BLOOD PRESSURE SUPPORT): 80.0000 ug | PREFILLED_SYRINGE | INTRAVENOUS | Status: DC | PRN

## 2024-07-17 MED ORDER — TETANUS-DIPHTH-ACELL PERTUSSIS 5-2.5-18.5 LF-MCG/0.5 IM SUSY: 0.5000 mL | PREFILLED_SYRINGE | Freq: Once | INTRAMUSCULAR | Status: DC

## 2024-07-17 MED ORDER — DIPHENHYDRAMINE HCL 50 MG/ML IJ SOLN: 12.5000 mg | INTRAMUSCULAR | Status: DC | PRN

## 2024-07-17 MED ORDER — ONDANSETRON HCL 4 MG PO TABS: 4.0000 mg | ORAL_TABLET | ORAL | Status: DC | PRN

## 2024-07-17 MED ORDER — POLYETHYLENE GLYCOL 3350 17 G PO PACK: 17.0000 g | PACK | Freq: Every day | ORAL | Status: DC | PRN

## 2024-07-17 MED ORDER — LACTATED RINGERS IV SOLN: 500.0000 mL | Freq: Once | INTRAVENOUS | Status: DC

## 2024-07-17 MED ORDER — ONDANSETRON HCL 4 MG/2ML IJ SOLN: 4.0000 mg | INTRAMUSCULAR | Status: DC | PRN

## 2024-07-17 MED ORDER — ACETAMINOPHEN 325 MG PO TABS
650.0000 mg | ORAL_TABLET | ORAL | Status: DC | PRN
  Administered 2024-07-18: 650 mg via ORAL
  Filled 2024-07-17: qty 2

## 2024-07-17 MED ORDER — OXYTOCIN-SODIUM CHLORIDE 30-0.9 UT/500ML-% IV SOLN
2.5000 [IU]/h | INTRAVENOUS | Status: DC
  Filled 2024-07-17: qty 500

## 2024-07-17 MED ORDER — PHENYLEPHRINE 80 MCG/ML (10ML) SYRINGE FOR IV PUSH (FOR BLOOD PRESSURE SUPPORT)
80.0000 ug | PREFILLED_SYRINGE | INTRAVENOUS | Status: DC | PRN
  Filled 2024-07-17: qty 10

## 2024-07-17 MED ORDER — COCONUT OIL OIL: 1.0000 | TOPICAL_OIL | Status: DC | PRN

## 2024-07-17 MED ORDER — DIBUCAINE (PERIANAL) 1 % EX OINT: 1.0000 | TOPICAL_OINTMENT | CUTANEOUS | Status: DC | PRN

## 2024-07-17 MED ORDER — SOD CITRATE-CITRIC ACID 500-334 MG/5ML PO SOLN
30.0000 mL | ORAL | Status: DC | PRN
Start: 2024-07-17 — End: 2024-07-17

## 2024-07-17 MED ORDER — AZITHROMYCIN 500 MG PO TABS
1000.0000 mg | ORAL_TABLET | Freq: Once | ORAL | Status: AC
  Administered 2024-07-17: 1000 mg via ORAL
  Filled 2024-07-17: qty 2

## 2024-07-17 MED ORDER — TERBUTALINE SULFATE 1 MG/ML IJ SOLN
INTRAMUSCULAR | Status: AC
  Filled 2024-07-17: qty 1

## 2024-07-17 MED ORDER — OXYTOCIN-SODIUM CHLORIDE 30-0.9 UT/500ML-% IV SOLN: 2.5000 [IU]/h | INTRAVENOUS | Status: DC | PRN

## 2024-07-17 NOTE — H&P (Signed)
 OBSTETRIC ADMISSION HISTORY AND PHYSICAL  Kenleigh Toback Andrzejewski is a 33 y.o. female G23P1011 with IUP at [redacted]w[redacted]d by LMP presenting for labor. She reports +FMs, No LOF, no VB, no blurry vision, headaches or peripheral edema, and RUQ pain. She is plans adoption for baby.  She received her prenatal care at [facility]   Initial visit @ 37wks  Sono:   No scan in chart   Prenatal History/Complications:  - Late to care at 37 weeks - Chlamydia infx at 37 weeks - pt states she did not get treatment - GBS positive status - Psychosocial: baby to be placed for adoption  Past Medical History: Past Medical History:  Diagnosis Date   Chlamydia infection 02/08/2022   Resulted in SAB at 12 weeks    Past Surgical History: Past Surgical History:  Procedure Laterality Date   ECTOPIC PREGNANCY SURGERY      Obstetrical History: OB History     Gravida  4   Para  1   Term  1   Preterm      AB  1   Living  1      SAB      IAB      Ectopic  1   Multiple  0   Live Births  1          OBhx: NSVD x 1- uncomplicated Ectopic pregnancy- surgical intervention  Social History Social History   Socioeconomic History   Marital status: Single    Spouse name: Not on file   Number of children: Not on file   Years of education: Not on file   Highest education level: Not on file  Occupational History   Not on file  Tobacco Use   Smoking status: Never   Smokeless tobacco: Never  Substance and Sexual Activity   Alcohol use: No   Drug use: No   Sexual activity: Yes    Birth control/protection: None  Other Topics Concern   Not on file  Social History Narrative   Not on file   Social Drivers of Health   Financial Resource Strain: Not on file  Food Insecurity: No Food Insecurity (07/17/2024)   Hunger Vital Sign    Worried About Running Out of Food in the Last Year: Never true    Ran Out of Food in the Last Year: Never true  Transportation Needs: No Transportation Needs (07/17/2024)   PRAPARE -  Administrator, Civil Service (Medical): No    Lack of Transportation (Non-Medical): No  Physical Activity: Not on file  Stress: Not on file  Social Connections: Not on file    Family History: History reviewed. No pertinent family history.  Allergies: No Known Allergies  Medications Prior to Admission  Medication Sig Dispense Refill Last Dose/Taking   Prenatal Vit-Fe Fumarate-FA (PRENATAL VITAMINS) 27-0.8 MG TABS Take 1 tablet by mouth daily. 30 each 2 07/17/2024   docusate sodium  (COLACE) 100 MG capsule Take 1 capsule (100 mg total) by mouth 2 (two) times daily as needed for mild constipation or moderate constipation. 30 capsule 2    ferrous sulfate  325 (65 FE) MG EC tablet Take 1 tablet (325 mg total) by mouth every other day. 30 tablet 2    ibuprofen  (ADVIL ) 800 MG tablet Take 1 tablet (800 mg total) by mouth every 8 (eight) hours as needed for moderate pain. 30 tablet 2    oxyCODONE  (OXY IR/ROXICODONE ) 5 MG immediate release tablet Take 1 tablet (5 mg total) by  mouth every 4 (four) hours as needed for severe pain or breakthrough pain. 15 tablet 0    pantoprazole  (PROTONIX ) 20 MG tablet TAKE 1 TABLET(20 MG) BY MOUTH DAILY 30 tablet 0      Review of Systems   All systems reviewed and negative except as stated in HPI  Blood pressure 116/69, pulse 79, temperature 97.7 F (36.5 C), temperature source Oral, resp. rate 18, height 5' 7 (1.702 m), weight 73 kg, last menstrual period 10/21/2023, SpO2 100%, unknown if currently breastfeeding. General appearance: alert and cooperative Lungs: clear to auscultation bilaterally Heart: regular rate and rhythm Abdomen: soft, non-tender; bowel sounds normal Extremities: Homans sign is negative, no sign of DVT Presentation: cephalic Fetal monitoring: 145/mod/+a/+occ variables Uterine activity: every 1-3 min Dilation: 8.5 Effacement (%): 100 Station: Plus 2 Exam by:: Acquanetta Blumenthal, RN   Prenatal labs: ABO, Rh: --/--/PENDING  (07/22 1319) Antibody: PENDING (07/22 1319) Rubella: 23.80 (07/15 1416) RPR: Non Reactive (07/15 1416)  HBsAg: Negative (07/15 1416)  HIV: Non Reactive (07/15 1416)  GBS: Positive/-- (07/15 1507)    Lab Results  Component Value Date   GBS Positive (A) 07/10/2024   A1c negative Genetic screening not done Anatomy US  not done  There is no immunization history for the selected administration types on file for this patient.  Prenatal Transfer Tool  Maternal Diabetes: A1c wnl Genetic Screening: Declined Maternal Ultrasounds/Referrals: Other: Not done due to late to care Fetal Ultrasounds or other Referrals:  None Maternal Substance Abuse:  No Significant Maternal Medications:  None Significant Maternal Lab Results: Group B Strep positive Number of Prenatal Visits:Less than or equal to 3 verified prenatal visits Maternal Vaccinations: None Other Comments:  None   Results for orders placed or performed during the hospital encounter of 07/17/24 (from the past 24 hours)  CBC   Collection Time: 07/17/24  1:19 PM  Result Value Ref Range   WBC 10.1 4.0 - 10.5 K/uL   RBC 4.16 3.87 - 5.11 MIL/uL   Hemoglobin 9.6 (L) 12.0 - 15.0 g/dL   HCT 69.7 (L) 63.9 - 53.9 %   MCV 72.6 (L) 80.0 - 100.0 fL   MCH 23.1 (L) 26.0 - 34.0 pg   MCHC 31.8 30.0 - 36.0 g/dL   RDW 84.0 (H) 88.4 - 84.4 %   Platelets 283 150 - 400 K/uL   nRBC 0.3 (H) 0.0 - 0.2 %  Type and screen [facility]   Collection Time: 07/17/24  1:19 PM  Result Value Ref Range   ABO/RH(D) PENDING    Antibody Screen PENDING    Sample Expiration      07/20/2024,2359 Performed at [facility], [address], [place], [place] [address]     Patient Active Problem List   Diagnosis Date Noted   Intrauterine normal pregnancy 07/17/2024   Chlamydia infection affecting pregnancy 07/15/2024   Group B Streptococcus carrier, antepartum 07/15/2024   Iron deficiency anemia during pregnancy 07/15/2024   Supervision  of high risk pregnancy, antepartum 07/10/2024    Assessment/Plan:  Carla G Klahn is a 33 y.o. G4P1011 at [redacted]w[redacted]d here for spontaneous labor  #Labor: Expectant management, will offer AROM once adequately tx with Ampicillin  #Pain: Per pt request #FWB: Cat II #GBS status:  Positive, on PCN #Feeding: N/A #Reproductive Life planning: to be discussed #Circ:  not applicable  -if possible will obtain US  to confirm dating  #Chlamydia infection: plan for treatment during admission  #Psychosocial: Plan is to place baby for adoption,  SW consult PP  Sharina Petre, DO Attending Obstetrician & Gynecologist, [facility] for [employer], [facility]

## 2024-07-17 NOTE — MAU Note (Signed)
 Adrienne Morris is a 33 y.o. at [redacted]w[redacted]d here in MAU reporting: CTXS and vaginal pressure that started yesterday at noon. Today CTXs are every 5 minutes. Reports active fetal movement and light pink tinge bleeding that started yesterday. Denies soaking through pads, bright red bleeding, and LOF. Pain 9/10 in lower abdomen.     Onset of complaint: yesterday  Pain score: 9/10 Vitals:   07/17/24 1247  BP: 116/69  Pulse: 79  Resp: 18  Temp: 97.7 F (36.5 C)  SpO2: 100%     FHT: 159 bpm  Lab orders placed from triage: none

## 2024-07-17 NOTE — Progress Notes (Signed)
 Labor progress note:  At bedside to elucidate patient history. She states this is her third pregnancy -- had an ectopic pregnancy requiring surgical intervention, followed by SVD of term infant. Her daughter is currently living with daughter's grandparents in [place]. Patient was previously living in [place], recently moved to [place]. She states she found out she was pregnant when she was 4-5 months along and had a visit with ?the health department, but no dating ultrasound ever done. No other visits that she reports. States she moved to [place] to be closer to her partner, who lives in the area. Feels safe in her relationship. 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 They are currently staying in an office there. She would appreciate assistance with housing from our social work team. Also does not have reliable transportation and would appreciate resources for transportation as well. After delivery, she plans on returning to [employer] [phone] [facility]) for both work and to stay. She also works at a [employer] and hopes to return to work there as well.   This pregnancy was a surprise -- she states she does not feels she has the resources (housing and transportation) to care for baby, so she decided on adoption. She has been working with an Transport planner, [employer], (contact [employer] [phone]).   She has voiced desire to hold baby after delivery but would like for baby to be moved to nursery afterwards.   Will place SW consult to assist further with resources and any other identified needs.   Alain Sor, MD OB Fellow, Faculty Practice [facility], Center for [facility]

## 2024-07-17 NOTE — Discharge Summary (Signed)
 Postpartum Discharge Summary  Date of Service updated-7/23     Patient Name: Adrienne Morris DOB: [birth year] MRN: [id number]  Date of admission: 07/17/2024 Delivery date:07/17/2024 Delivering provider: VON REASONER Date of discharge: 07/18/2024  Admitting diagnosis: Pregnancy at 38/4. Active labor Additional problems: New OB at 37 weeks. Recent chlamydia diagnosis. GBS+ . Social concerns. Anemia in pregnancy   Discharge diagnosis: Term Pregnancy Delivered                                              Post partum procedures:none Augmentation: AROM Complications: None  Hospital course: Onset of Labor With Vaginal Delivery      33 y.o. yo [id number] at [redacted]w[redacted]d was admitted in Active Labor on 07/17/2024. Labor course was complicated by nothing.  Membrane Rupture Time/Date: 5:54 PM,07/17/2024  Delivery Method:Vaginal, Spontaneous Operative Delivery:N/A Episiotomy: None Lacerations:  Labial Patient had a postpartum course complicated by difficult social circumstances. She saw social work and there does not appear to be barriers to discharge; pt plans on adoption and agency is involved. She was treated for her chlamydia during her admission with Azithromycin . She is ambulating, tolerating a regular diet, passing flatus, and urinating well. Patient is discharged home in stable condition on 07/18/24.  Newborn Data: Birth date:07/17/2024 Birth time:7:18 PM Gender:Female Living status:Living Apgars:9 ,9  Weight:2750 g  Magnesium Sulfate received: No BMZ received: No Rhophylac:N/A MMR:N/A T-DaP:NA Flu: No RSV Vaccine received: No Transfusion:No Immunizations administered: There is no immunization history for the selected administration types on file for this patient.  Physical exam  Vitals:   07/18/24 0205 07/18/24 0627 07/18/24 0755 07/18/24 1124  BP: 116/67 123/66 110/71 120/74  Pulse: 78 86 85 86  Resp: 17 16 17 17   Temp: 98.3 F (36.8 C) 98.5 F (36.9 C) 98.2 F (36.8 C) 98.4  F (36.9 C)  TempSrc: Oral Oral Oral Oral  SpO2: 100% 99% 100% 100%  Weight:      Height:       General: alert, cooperative, and no distress Lochia: appropriate Uterine Fundus: firm Incision: N/A DVT Evaluation: No evidence of DVT seen on physical exam. Labs: Lab Results  Component Value Date   WBC 15.9 (H) 07/18/2024   HGB 8.8 (L) 07/18/2024   HCT 27.6 (L) 07/18/2024   MCV 72.1 (L) 07/18/2024   PLT 276 07/18/2024      Latest Ref Rng & Units 02/08/2022    1:14 PM  CMP  Glucose 70 - 99 mg/dL 88   BUN 6 - 20 mg/dL 8   Creatinine 9.55 - 8.99 mg/dL 9.47   Sodium 864 - 854 mmol/L 130   Potassium 3.5 - 5.1 mmol/L 3.2   Chloride 98 - 111 mmol/L 101   CO2 22 - 32 mmol/L 20   Calcium  8.9 - 10.3 mg/dL 8.7   Total Protein 6.5 - 8.1 g/dL 7.3   Total Bilirubin 0.3 - 1.2 mg/dL 0.5   Alkaline Phos 38 - 126 U/L 49   AST 15 - 41 U/L 16   ALT 0 - 44 U/L 11    Edinburgh Score:    07/18/2024   10:59 AM  Edinburgh Postnatal Depression Scale Screening Tool  I have been able to laugh and see the funny side of things. 1  I have looked forward with enjoyment to things. 1  I have blamed myself unnecessarily  when things went wrong. 2  I have been anxious or worried for no good reason. 0  I have felt scared or panicky for no good reason. 2  Things have been getting on top of me. 2  I have been so unhappy that I have had difficulty sleeping. 0  I have felt sad or miserable. 0  I have been so unhappy that I have been crying. 2  The thought of harming myself has occurred to me. 2  Edinburgh Postnatal Depression Scale Total 12      After visit meds:  Allergies as of 07/18/2024   No Known Allergies      Medication List     STOP taking these medications    oxyCODONE  5 MG immediate release tablet Commonly known as: Oxy IR/ROXICODONE        TAKE these medications    acetaminophen  325 MG tablet Commonly known as: Tylenol  Take 2 tablets (650 mg total) by mouth every 4 (four)  hours as needed (for pain scale < 4).   docusate sodium  100 MG capsule Commonly known as: COLACE Take 1 capsule (100 mg total) by mouth 2 (two) times daily as needed for mild constipation or moderate constipation.   ferrous sulfate  325 (65 FE) MG EC tablet Take 1 tablet (325 mg total) by mouth every other day.   ibuprofen  600 MG tablet Commonly known as: ADVIL  Take 1 tablet (600 mg total) by mouth every 6 (six) hours. What changed:  medication strength how much to take when to take this reasons to take this   pantoprazole  20 MG tablet Commonly known as: PROTONIX  TAKE 1 TABLET(20 MG) BY MOUTH DAILY   Prenatal Vitamins 27-0.8 MG Tabs Take 1 tablet by mouth daily.         Discharge home in stable condition Infant Feeding: NA bottle feeding Infant Disposition:adoption planned Discharge instruction: per After Visit Summary and Postpartum booklet. Activity: Advance as tolerated. Pelvic rest for 6 weeks.  Diet: routine diet Anticipated Birth Control: Depo Postpartum Appointment:6 weeks Additional Postpartum F/U: NA Future Appointments: Future Appointments  Date Time Provider Department Center  08/30/2024  9:55 AM Zina Jerilynn LABOR, MD [facility]   Follow up Visit:  Follow-up Information     Center for [facility] Follow up on 08/30/2024.   Specialty: Obstetrics and Gynecology Why: routine after baby visit Contact information: [address] [facility]   [address] [phone]                    07/18/2024 Jennifer M Ozan, DO

## 2024-07-17 NOTE — Anesthesia Preprocedure Evaluation (Signed)
 Anesthesia Evaluation  Patient identified by MRN, date of birth, ID band Patient awake    Reviewed: Allergy & Precautions, NPO status , Patient's Chart, lab work & pertinent test results  Airway Mallampati: II  TM Distance: >3 FB Neck ROM: Full    Dental no notable dental hx. (+) Teeth Intact, Dental Advisory Given   Pulmonary neg pulmonary ROS   Pulmonary exam normal breath sounds clear to auscultation       Cardiovascular negative cardio ROS Normal cardiovascular exam Rhythm:Regular Rate:Normal     Neuro/Psych negative neurological ROS     GI/Hepatic negative GI ROS, Neg liver ROS,,,  Endo/Other  negative endocrine ROS    Renal/GU negative Renal ROS     Musculoskeletal negative musculoskeletal ROS (+)    Abdominal   Peds  Hematology Lab Results      Component                Value               Date                      WBC                      8.4                 07/10/2024                HGB                      9.1 (L)             07/10/2024                HCT                      30.1 (L)            07/10/2024                MCV                      76 (L)              07/10/2024                PLT                      284                 07/10/2024              Anesthesia Other Findings   Reproductive/Obstetrics (+) Pregnancy                              Anesthesia Physical Anesthesia Plan  ASA: 2  Anesthesia Plan: Combined Spinal and Epidural   Post-op Pain Management:    Induction:   PONV Risk Score and Plan:   Airway Management Planned:   Additional Equipment:   Intra-op Plan:   Post-operative Plan:   Informed Consent: I have reviewed the patients History and Physical, chart, labs and discussed the procedure including the risks, benefits and alternatives for the proposed anesthesia with the patient or authorized representative who has indicated his/her  understanding and acceptance.       Plan Discussed with:  Anesthesia Plan Comments: (38.4 wk G4p1 w limited prenatal care for LEA)        Anesthesia Quick Evaluation

## 2024-07-17 NOTE — Anesthesia Procedure Notes (Addendum)
 Spinal  Patient location during procedure: OR Start time: 07/17/2024 1:39 PM End time: 07/17/2024 1:53 PM Reason for block: surgical anesthesia Staffing Performed: anesthesiologist  Anesthesiologist: Jefm Garnette LABOR, MD Performed by: Jefm Garnette LABOR, MD Authorized by: Jefm Garnette LABOR, MD   Preanesthetic Checklist Completed: patient identified, IV checked, site marked, risks and benefits discussed, surgical consent, monitors and equipment checked, pre-op evaluation and timeout performed Spinal Block Patient position: sitting Prep: DuraPrep and site prepped and draped Patient monitoring: cardiac monitor, continuous pulse ox, blood pressure and heart rate Approach: midline Location: L3-4 Injection technique: catheter Needle Needle type: Tuohy and Sprotte  Needle gauge: 24 G Needle length: 12.7 cm Needle insertion depth: 5 cm Catheter type: closed end flexible Catheter size: 19 g Assessment Sensory level: T4 Events: CSF return Additional Notes  1 Attempt (s). Pt tolerated procedure well.  Epidura; catheter at 11cm at skin 5 cm in space  1 attempt

## 2024-07-17 NOTE — Progress Notes (Signed)
 Labor progress note:  In to re-eval patient. Now adequate with abx for GBS ppx. Pt comfortable with epidural. EFM: 145/mod/+a/-d. Cervix rechecked, now 8/90/-1. Discussed role of AROM in augmentation of labor and patient provided verbal consent. AROM performed with small amount of clear fluid. Will eval contraction pattern and start Pitocin  if ctx not regular.   Alain Sor, MD OB Fellow, Faculty Practice [facility], Center for [facility]

## 2024-07-18 ENCOUNTER — Encounter: Admitting: Family Medicine

## 2024-07-18 ENCOUNTER — Other Ambulatory Visit (HOSPITAL_COMMUNITY): Payer: Self-pay

## 2024-07-18 LAB — RPR: RPR Ser Ql: NONREACTIVE

## 2024-07-18 LAB — CBC
HCT: 27.6 % — ABNORMAL LOW (ref 36.0–46.0)
Hemoglobin: 8.8 g/dL — ABNORMAL LOW (ref 12.0–15.0)
MCH: 23 pg — ABNORMAL LOW (ref 26.0–34.0)
MCHC: 31.9 g/dL (ref 30.0–36.0)
MCV: 72.1 fL — ABNORMAL LOW (ref 80.0–100.0)
Platelets: 276 K/uL (ref 150–400)
RBC: 3.83 MIL/uL — ABNORMAL LOW (ref 3.87–5.11)
RDW: 16 % — ABNORMAL HIGH (ref 11.5–15.5)
WBC: 15.9 K/uL — ABNORMAL HIGH (ref 4.0–10.5)
nRBC: 0.3 % — ABNORMAL HIGH (ref 0.0–0.2)

## 2024-07-18 LAB — TYPE AND SCREEN
ABO/RH(D): A POS
Antibody Screen: NEGATIVE

## 2024-07-18 LAB — HIV ANTIBODY (ROUTINE TESTING W REFLEX): HIV Screen 4th Generation wRfx: NONREACTIVE

## 2024-07-18 MED ORDER — MEDROXYPROGESTERONE ACETATE 150 MG/ML IM SUSP
150.0000 mg | Freq: Once | INTRAMUSCULAR | Status: AC
  Administered 2024-07-18: 150 mg via INTRAMUSCULAR
  Filled 2024-07-18: qty 1

## 2024-07-18 MED ORDER — IBUPROFEN 600 MG PO TABS
600.0000 mg | ORAL_TABLET | Freq: Four times a day (QID) | ORAL | 0 refills | Status: AC
  Filled 2024-07-18: qty 30, 8d supply, fill #0

## 2024-07-18 MED ORDER — ACETAMINOPHEN 325 MG PO TABS
650.0000 mg | ORAL_TABLET | ORAL | 0 refills | Status: AC | PRN
  Filled 2024-07-18: qty 30, 3d supply, fill #0

## 2024-07-18 NOTE — Plan of Care (Signed)
  Problem: Education: Goal: Knowledge of General Education information will improve Description: Including pain rating scale, medication(s)/side effects and non-pharmacologic comfort measures Outcome: Adequate for Discharge   Problem: Health Behavior/Discharge Planning: Goal: Ability to manage health-related needs will improve Outcome: Adequate for Discharge   Problem: Clinical Measurements: Goal: Ability to maintain clinical measurements within normal limits will improve Outcome: Adequate for Discharge Goal: Will remain free from infection Outcome: Adequate for Discharge Goal: Diagnostic test results will improve Outcome: Adequate for Discharge Goal: Respiratory complications will improve Outcome: Adequate for Discharge Goal: Cardiovascular complication will be avoided Outcome: Adequate for Discharge   Problem: Activity: Goal: Risk for activity intolerance will decrease Outcome: Adequate for Discharge   Problem: Nutrition: Goal: Adequate nutrition will be maintained Outcome: Adequate for Discharge   Problem: Coping: Goal: Level of anxiety will decrease Outcome: Adequate for Discharge   Problem: Elimination: Goal: Will not experience complications related to bowel motility Outcome: Adequate for Discharge Goal: Will not experience complications related to urinary retention Outcome: Adequate for Discharge   Problem: Pain Managment: Goal: General experience of comfort will improve and/or be controlled Outcome: Adequate for Discharge   Problem: Safety: Goal: Ability to remain free from injury will improve Outcome: Adequate for Discharge   Problem: Skin Integrity: Goal: Risk for impaired skin integrity will decrease Outcome: Adequate for Discharge   Problem: Education: Goal: Knowledge of Childbirth will improve Outcome: Adequate for Discharge Goal: Ability to make informed decisions regarding treatment and plan of care will improve Outcome: Adequate for  Discharge Goal: Ability to state and carry out methods to decrease the pain will improve Outcome: Adequate for Discharge Goal: Individualized Educational Video(s) Outcome: Adequate for Discharge   Problem: Coping: Goal: Ability to verbalize concerns and feelings about labor and delivery will improve Outcome: Adequate for Discharge   Problem: Life Cycle: Goal: Ability to make normal progression through stages of labor will improve Outcome: Adequate for Discharge Goal: Ability to effectively push during vaginal delivery will improve Outcome: Adequate for Discharge   Problem: Role Relationship: Goal: Will demonstrate positive interactions with the child Outcome: Adequate for Discharge   Problem: Safety: Goal: Risk of complications during labor and delivery will decrease Outcome: Adequate for Discharge   Problem: Pain Management: Goal: Relief or control of pain from uterine contractions will improve Outcome: Adequate for Discharge   Problem: Education: Goal: Knowledge of condition will improve Outcome: Adequate for Discharge Goal: Individualized Educational Video(s) Outcome: Adequate for Discharge Goal: Individualized Newborn Educational Video(s) Outcome: Adequate for Discharge   Problem: Activity: Goal: Will verbalize the importance of balancing activity with adequate rest periods Outcome: Adequate for Discharge Goal: Ability to tolerate increased activity will improve Outcome: Adequate for Discharge   Problem: Coping: Goal: Ability to identify and utilize available resources and services will improve Outcome: Adequate for Discharge   Problem: Life Cycle: Goal: Chance of risk for complications during the postpartum period will decrease Outcome: Adequate for Discharge   Problem: Role Relationship: Goal: Ability to demonstrate positive interaction with newborn will improve Outcome: Adequate for Discharge   Problem: Skin Integrity: Goal: Demonstration of wound healing  without infection will improve Outcome: Adequate for Discharge

## 2024-07-18 NOTE — Social Work (Addendum)
 CSW acknowledged consult baby to be placed for adoption and completed a clinical assessment. [name] confirmed that she has chosen Environmental education officer and verbalized understanding that CSW would contact the agency. CSW also offered ongoing support to [facility] while she remains inpatient.  RN stated that [name] desires to be discharged today. RN made aware that [name] must complete adoption paperwork before she discharges.  CSW contacted [facility] representative [place] [phone]) and notified her of [name]'s decision and wishes. Per representative, ETA, is about an hour and thirty minutes. The agency agreed to update [name] as well.  CSW will enter assessment at later time.   Adrienne Morris, MSW, LCSW Clinical Social Worker  [phone] 07/18/2024  11:22 AM

## 2024-07-18 NOTE — Progress Notes (Signed)
   07/18/24 1643  Departure Condition  Departure Condition Good  Mobility at [facility]  Patient/Caregiver Teaching Teach Back Method Used;Discharge instructions reviewed;Prescriptions reviewed;Follow-up care reviewed;Admission discussed;Pain management discussed;Medications discussed;Patient/caregiver verbalized understanding  Special teaching needs: Language  Departure Mode With friend  Was procedural sedation performed on this patient during this visit? No   Pt alert and oriented x4, vital signs and pain stable at time of discharge

## 2024-07-18 NOTE — Anesthesia Postprocedure Evaluation (Signed)
 Anesthesia Post Note  Patient: Adrienne Morris  Procedure(s) Performed: AN AD HOC LABOR EPIDURAL     Patient location during evaluation: Mother Baby Anesthesia Type: Epidural Level of consciousness: awake and alert and oriented Pain management: satisfactory to patient Vital Signs Assessment: post-procedure vital signs reviewed and stable Respiratory status: respiratory function stable Cardiovascular status: stable Postop Assessment: no headache, no backache, epidural receding, patient able to bend at knees, no signs of nausea or vomiting, adequate PO intake and able to ambulate Anesthetic complications: no   No notable events documented.  Last Vitals:  Vitals:   07/18/24 0627 07/18/24 0755  BP: 123/66 110/71  Pulse: 86 85  Resp: 16 17  Temp: 36.9 C 36.8 C  SpO2: 99% 100%    Last Pain:  Vitals:   07/18/24 0758  TempSrc:   PainSc: 7    Pain Goal:                Epidural/Spinal Function Cutaneous sensation: Normal sensation (07/18/24 0754), Patient able to flex knees: Yes (07/18/24 0754), Patient able to lift hips off bed: Yes (07/18/24 0754), Back pain beyond tenderness at insertion site: No (07/18/24 0754), Progressively worsening motor and/or sensory loss: No (07/18/24 0754), Bowel and/or bladder incontinence post epidural: No (07/18/24 0754)  PURNELL PORTAL

## 2024-07-18 NOTE — Clinical Social Work Maternal (Signed)
 CLINICAL SOCIAL WORK MATERNAL/CHILD NOTE  Patient Details  Name: Adrienne Morris MRN: [id number] Date of Birth: 08/14/1991  Date:  01/09/24  Clinical Social Worker Initiating Note:  [place], [place] Date/Time: Initiated:  07/18/24/1000     Child's Name:  Shella   Biological Parents:  Mother, Father (Mother: Adrienne Morris 03/06/1992, Father: Hazeline Saltness 10/15/1984)   Need for Interpreter:  [place]   Reason for Referral:  Adoption, Homeless, Behavioral Health Concerns  Address:  [address] [place] [address]    Phone number:  [phone] (home)     Additional phone number:   Household Members/Support Persons (HM/SP):   Household Member/Support Person 1   HM/SP Name Relationship DOB or Age  HM/SP -1 Phu Nay Significant Other 10/15/1984  HM/SP -2        HM/SP -3        HM/SP -4        HM/SP -5        HM/SP -6        HM/SP -7        HM/SP -8          Natural Supports (not living in the home):      Professional Supports: None   Employment: Full-time   Type of Work: Community education officer   Education:  [employer] school graduate   Homebound arranged:    Surveyor, quantity Resources:  Medicaid   Other Resources:  [facility]   Cultural/Religious Considerations Which May Impact Care:    Strengths:  Other  (Comment) (Has chosen to place baby up for adoption)   Psychotropic Medications:         Pediatrician:       Pediatrician List:   [facility] [place]    [place]    [facility]      Pediatrician Fax Number:    Risk Factors/Current Problems:  Basic Needs  , Transportation     Cognitive State:  Alert     Mood/Affect:  Calm  , Flat     CSW Assessment: CSW received consult for [place] 12, Homeless Issues and giving the baby up for adoption. CSW met with [name] to offer support and complete assessment.  CSW used virtual interpreter Nam [phone] & [employer]lam #[id number]. CSW met with [name] at bedside. [name]  appeared calm and was lying in the bed. CSW introduced role and reason for the visit. CSW acknowledged [name]'s decision to place the infant up for adoption and that CSW was there to offer her support. [name] appeared calm and with a flat affect. She agreed to complete an assessment with CSW and engaged appropriately during the assessment.   CSW assessed [name]'s feeling surrounding her decision to place the baby up for adoption. [name] expressed that she had been feeling "okay". CSW asked [name] if anyone had coerced/forced her into giving her baby up for adoption. [name] responded, no. [name] confirmed that she had chosen [facility] ([employer]) and verbalized understanding that CSW would contact the agency. [name] verbalized understanding. CSW informed [name] that she had seven days to revoke her relinquishment and that she would need to call the adoption agency directly. [name] verbalized understanding.  CSW inquired about [name]'s living situation. She reported that she and FOB have been homeless for about five months and were previously staying with FOB's family at [address], [place], [address]. 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 She reported they also stay with FOB's family a few nights a week.  CSW inquired about the whereabouts of [name]'s older child. [name] reported that her six-year-old daughter currently lives with family in [place], [place]. CSW provided [name] with information for housing assistance. CSW offered to make a referral via the Find Help resource platform and [name] gave CSW permission to make referral. She confirmed that her contact information on hospital file was correct.  [name] shared that FOB speaks and understands English so he helps translate if she does not understand. CSW inquired if [name] received WIC/SNAP benefits. [name] reported that she received WIC benefits but had stop receiving SNAP benefits about  six months ago. CSW encouraged [name] to follow up with Department of Social Services regarding her recertification for SNAP benefits. [name] verbalized understanding. CSW inquired about [name]'s support system. [name] reported that she and FOB have no support.  CSW discussed [name]'s [place] 12, which indicated that she had thoughts of harming herself. CSW assessed further. [name] expressed during the pregnancy she had thoughts of harming herself when she felt stressed and denied that she wanted to act on these thoughts. CSW acknowledged [name]'s social stressors and discussed it's impact on her mental health concerns. CSW inquired if [name] had a mental health history. [name] denied mental health history. CSW inquired if [name] had experienced postpartum depression with her older children. [name] denied experiencing postpartum depression symptoms. CSW provided education regarding the baby blues period vs. perinatal mood disorders and offered mental health resources. [name] declined mental health resources. CSW recommended [name] complete a self-evaluation during the postpartum time period using the New Mom Checklist from Postpartum Progress and encouraged [name] to contact a medical professional if symptoms are noted at any time.  [name] verbalized understanding. CSW assessed [name] for safety. [name] denied that had current thoughts of harming herself and others CSW inquired about domestic violence concerns. [name] denied domestic violence concerns and reported that she felt safe.   CSW inquired about [name]'s limited prenatal care. [name] reported that she missed prenatal care appointments because she did not have transportation to the appointment. CSW informed [name] about [facility] transportation and encouraged her to utilize the service for future appointments. CSW informed [name] about the hospital drug screen policy and due to limited prenatal care (per chart review, 1 visit) her infant was screened. CSW informed [name] that the infant's urine drug screen  resulted for Amphetamines. CSW inquired about [name]'s substance use during the pregnancy. [name] reported, no, I don't do drugs and denied substance use and stated that she had only taken Advil. [name] reported that she had been around "smoke" and suggested that may be the reason her infant's positive screen. CSW informed [name] that CSW would make a report to [facility] CPS regarding the infant's urine drug screen results. [name] reported understanding and had no questions. CSW asked [name] if she had CPS history. [name] denied CPS history.  CSW assessed [name] for additional needs. [name] reported none.  CSW contacted the [facility] ([employer]) and informed the representative Lossie Archer, [phone]) regarding [name]'s decision and wishes. CSW also offered ongoing support to [facility] while she remains inpatient.   1:25 PM CSW escorted [place] [facility] representative Lossie Archer) to [name]'s room to complete adoption paperwork. CSW obtain a copy of the "Relinquishment of Minor for Adoption by Parent or Guardian or Guardian Ad  Litem of the Mother/Father, the Acceptance of Relinquishment of Minor for Adoption by Parent of Guardian or Guardian Ad Litem of the Mother/Father, and the Request and Authorization for Use/Disclosure of Protected Health Information and placed a sticker on all copies then filed in the infant's paper chart.  CSW obtained a copy of the agency representative ID Lossie Archer) and filed in the infant's chart then escorted her to the nursey to see the infant.  Per representative, the adoption agency agreed to provide transportation for [name] at discharge.  CSW notified security that [employer] has custody of the infant and can visit at any time.  CSW will continue to offer support while the infant remains in the nursery.   CSW Plan/Description:  No Further Intervention Required/No Barriers to Discharge, Perinatal Mood and Anxiety Disorder  (PMADs) Education, Other Information/Referral to [employer], LCSW 2024/11/21, 3:54 PM

## 2024-07-20 LAB — SURGICAL PATHOLOGY

## 2024-07-23 ENCOUNTER — Telehealth (HOSPITAL_COMMUNITY): Payer: Self-pay | Admitting: *Deleted

## 2024-07-23 DIAGNOSIS — Z1331 Encounter for screening for depression: Secondary | ICD-10-CM

## 2024-07-23 NOTE — Telephone Encounter (Signed)
 Patient scored 12 on EPDS in the hospital, answer to question ten was 2-Sometimes.   Per CSW note dated 07/18/2024, social stressors include patient placing this baby for adoption and patient being unhoused.  Placed order for [facility] referral. Spoke to Dr. Barbra via [employer] and shared above information.  Mliss Sieve, RN 07/23/2024 13:03

## 2024-08-04 ENCOUNTER — Telehealth (HOSPITAL_COMMUNITY): Payer: Self-pay

## 2024-08-04 NOTE — Telephone Encounter (Signed)
 08/04/2024 1446  Name: Adrienne Morris MRN: [id number] DOB: [birth year]  Reason for Call:  Transition of Care Hospital Discharge Call  Contact Status: Patient Contact Status: Unable to contact (No answer, no voicemail option)  Language assistant needed: Interpreter Mode: Telephonic Interpreter Interpreter Name: [phone] Interpreter Phone Number - If applicable: [phone]        Follow-Up Questions:    Van Postnatal Depression Scale:  In the Past 7 Days:    PHQ2-9 Depression Scale:     Discharge Follow-up:    Post-discharge interventions: NA  Signature  Rosaline Deretha PEAK

## 2024-08-30 ENCOUNTER — Ambulatory Visit: Admitting: Obstetrics and Gynecology
